# Patient Record
Sex: Female | Born: 1965 | Race: White | Hispanic: No | Marital: Married | Smoking: Never smoker
Health system: Southern US, Community
[De-identification: ages and names within clinical notes are randomized; demographics above are authoritative.]

## PROBLEM LIST (undated history)

## (undated) DIAGNOSIS — Z923 Personal history of irradiation: Secondary | ICD-10-CM

## (undated) DIAGNOSIS — Z1239 Encounter for other screening for malignant neoplasm of breast: Secondary | ICD-10-CM

## (undated) DIAGNOSIS — C50519 Malignant neoplasm of lower-outer quadrant of unspecified female breast: Secondary | ICD-10-CM

## (undated) DIAGNOSIS — E669 Obesity, unspecified: Secondary | ICD-10-CM

## (undated) DIAGNOSIS — E119 Type 2 diabetes mellitus without complications: Secondary | ICD-10-CM

## (undated) DIAGNOSIS — IMO0002 Reserved for concepts with insufficient information to code with codable children: Secondary | ICD-10-CM

## (undated) DIAGNOSIS — R920 Mammographic microcalcification found on diagnostic imaging of breast: Secondary | ICD-10-CM

## (undated) DIAGNOSIS — R928 Other abnormal and inconclusive findings on diagnostic imaging of breast: Secondary | ICD-10-CM

## (undated) DIAGNOSIS — C50919 Malignant neoplasm of unspecified site of unspecified female breast: Secondary | ICD-10-CM

## (undated) HISTORY — DX: Other abnormal and inconclusive findings on diagnostic imaging of breast: R92.8

## (undated) HISTORY — DX: Type 2 diabetes mellitus without complications: E11.9

## (undated) HISTORY — DX: Reserved for concepts with insufficient information to code with codable children: IMO0002

## (undated) HISTORY — PX: TUBAL LIGATION: SHX77

## (undated) HISTORY — DX: Mammographic microcalcification found on diagnostic imaging of breast: R92.0

## (undated) HISTORY — DX: Obesity, unspecified: E66.9

## (undated) HISTORY — DX: Encounter for other screening for malignant neoplasm of breast: Z12.39

## (undated) HISTORY — DX: Malignant neoplasm of lower-outer quadrant of unspecified female breast: C50.519

---

## 2007-04-22 ENCOUNTER — Ambulatory Visit: Payer: Self-pay

## 2009-04-25 ENCOUNTER — Ambulatory Visit: Payer: Self-pay

## 2010-06-04 ENCOUNTER — Ambulatory Visit: Payer: Self-pay

## 2011-07-02 ENCOUNTER — Ambulatory Visit: Payer: Self-pay

## 2011-08-26 DIAGNOSIS — Z1239 Encounter for other screening for malignant neoplasm of breast: Secondary | ICD-10-CM

## 2011-08-26 DIAGNOSIS — E669 Obesity, unspecified: Secondary | ICD-10-CM | POA: Insufficient documentation

## 2011-08-26 DIAGNOSIS — E119 Type 2 diabetes mellitus without complications: Secondary | ICD-10-CM

## 2011-08-26 DIAGNOSIS — R920 Mammographic microcalcification found on diagnostic imaging of breast: Secondary | ICD-10-CM

## 2011-08-26 DIAGNOSIS — R928 Other abnormal and inconclusive findings on diagnostic imaging of breast: Secondary | ICD-10-CM

## 2011-08-26 HISTORY — DX: Mammographic microcalcification found on diagnostic imaging of breast: R92.0

## 2011-08-26 HISTORY — DX: Type 2 diabetes mellitus without complications: E11.9

## 2011-08-26 HISTORY — DX: Obesity, unspecified: E66.9

## 2011-08-26 HISTORY — DX: Other abnormal and inconclusive findings on diagnostic imaging of breast: R92.8

## 2011-08-26 HISTORY — PX: BREAST LUMPECTOMY: SHX2

## 2011-08-26 HISTORY — DX: Encounter for other screening for malignant neoplasm of breast: Z12.39

## 2012-07-07 ENCOUNTER — Ambulatory Visit: Payer: Self-pay

## 2012-07-09 ENCOUNTER — Ambulatory Visit: Payer: Self-pay

## 2012-07-25 DIAGNOSIS — C50919 Malignant neoplasm of unspecified site of unspecified female breast: Secondary | ICD-10-CM

## 2012-07-25 HISTORY — DX: Malignant neoplasm of unspecified site of unspecified female breast: C50.919

## 2012-08-02 ENCOUNTER — Ambulatory Visit: Payer: Self-pay | Admitting: General Surgery

## 2012-08-02 HISTORY — PX: BREAST BIOPSY: SHX20

## 2012-08-12 ENCOUNTER — Ambulatory Visit: Payer: Self-pay | Admitting: General Surgery

## 2012-08-24 ENCOUNTER — Ambulatory Visit: Payer: Self-pay | Admitting: General Surgery

## 2012-08-24 DIAGNOSIS — C50519 Malignant neoplasm of lower-outer quadrant of unspecified female breast: Secondary | ICD-10-CM

## 2012-08-24 HISTORY — PX: BREAST EXCISIONAL BIOPSY: SUR124

## 2012-08-24 HISTORY — DX: Malignant neoplasm of lower-outer quadrant of unspecified female breast: C50.519

## 2012-08-24 HISTORY — PX: BREAST SURGERY: SHX581

## 2012-08-25 ENCOUNTER — Ambulatory Visit: Payer: Self-pay | Admitting: Radiation Oncology

## 2012-08-27 LAB — PATHOLOGY REPORT

## 2012-09-03 ENCOUNTER — Ambulatory Visit: Payer: Self-pay | Admitting: General Surgery

## 2012-09-03 HISTORY — PX: BREAST SURGERY: SHX581

## 2012-09-13 ENCOUNTER — Ambulatory Visit: Payer: Self-pay | Admitting: Radiation Oncology

## 2012-09-25 ENCOUNTER — Ambulatory Visit: Payer: Self-pay | Admitting: Radiation Oncology

## 2012-10-05 DIAGNOSIS — IMO0001 Reserved for inherently not codable concepts without codable children: Secondary | ICD-10-CM

## 2012-10-05 HISTORY — DX: Reserved for inherently not codable concepts without codable children: IMO0001

## 2012-10-14 LAB — CBC CANCER CENTER
Basophil #: 0 x10 3/mm (ref 0.0–0.1)
Eosinophil #: 0.2 x10 3/mm (ref 0.0–0.7)
Eosinophil %: 1.8 %
Lymphocyte #: 2.7 x10 3/mm (ref 1.0–3.6)
MCHC: 32.4 g/dL (ref 32.0–36.0)
Monocyte #: 0.7 x10 3/mm (ref 0.2–0.9)
Neutrophil #: 5.9 x10 3/mm (ref 1.4–6.5)
Neutrophil %: 62.2 %
RBC: 4.54 10*6/uL (ref 3.80–5.20)
RDW: 13.8 % (ref 11.5–14.5)
WBC: 9.6 x10 3/mm (ref 3.6–11.0)

## 2012-10-21 LAB — CBC CANCER CENTER
Eosinophil #: 0.2 x10 3/mm (ref 0.0–0.7)
HCT: 37.5 % (ref 35.0–47.0)
Lymphocyte %: 25.9 %
MCHC: 33.2 g/dL (ref 32.0–36.0)
Monocyte #: 0.8 x10 3/mm (ref 0.2–0.9)
Neutrophil #: 5.7 x10 3/mm (ref 1.4–6.5)
RBC: 4.46 10*6/uL (ref 3.80–5.20)
WBC: 9.1 x10 3/mm (ref 3.6–11.0)

## 2012-10-23 ENCOUNTER — Ambulatory Visit: Payer: Self-pay | Admitting: Radiation Oncology

## 2012-10-28 LAB — CBC CANCER CENTER
Basophil %: 0.4 %
Eosinophil #: 0.2 x10 3/mm (ref 0.0–0.7)
HCT: 37.4 % (ref 35.0–47.0)
Lymphocyte #: 2.4 x10 3/mm (ref 1.0–3.6)
Lymphocyte %: 25.7 %
MCH: 28.2 pg (ref 26.0–34.0)
MCV: 84 fL (ref 80–100)
Monocyte #: 0.9 x10 3/mm (ref 0.2–0.9)
Monocyte %: 9.6 %
Neutrophil #: 5.8 x10 3/mm (ref 1.4–6.5)
Neutrophil %: 62.2 %
Platelet: 332 x10 3/mm (ref 150–440)
RBC: 4.45 10*6/uL (ref 3.80–5.20)

## 2012-11-04 LAB — CBC CANCER CENTER
Basophil #: 0 x10 3/mm (ref 0.0–0.1)
Basophil %: 0.4 %
Eosinophil %: 1.8 %
HCT: 37.9 % (ref 35.0–47.0)
HGB: 12.8 g/dL (ref 12.0–16.0)
Lymphocyte #: 1.8 x10 3/mm (ref 1.0–3.6)
MCH: 28.4 pg (ref 26.0–34.0)
MCHC: 33.8 g/dL (ref 32.0–36.0)
MCV: 84 fL (ref 80–100)
Monocyte #: 0.7 x10 3/mm (ref 0.2–0.9)
Neutrophil #: 5.8 x10 3/mm (ref 1.4–6.5)
Neutrophil %: 68.4 %
Platelet: 344 x10 3/mm (ref 150–440)
RBC: 4.51 10*6/uL (ref 3.80–5.20)
WBC: 8.4 x10 3/mm (ref 3.6–11.0)

## 2012-11-18 ENCOUNTER — Encounter: Payer: Self-pay | Admitting: *Deleted

## 2012-11-18 DIAGNOSIS — C50519 Malignant neoplasm of lower-outer quadrant of unspecified female breast: Secondary | ICD-10-CM | POA: Insufficient documentation

## 2012-11-23 ENCOUNTER — Ambulatory Visit: Payer: Self-pay | Admitting: Radiation Oncology

## 2012-11-23 ENCOUNTER — Encounter: Payer: Self-pay | Admitting: General Surgery

## 2012-11-23 ENCOUNTER — Ambulatory Visit (INDEPENDENT_AMBULATORY_CARE_PROVIDER_SITE_OTHER): Payer: BC Managed Care – PPO | Admitting: General Surgery

## 2012-11-23 VITALS — BP 140/80 | HR 68 | Resp 16 | Ht 64.0 in | Wt 216.0 lb

## 2012-11-23 DIAGNOSIS — D0512 Intraductal carcinoma in situ of left breast: Secondary | ICD-10-CM

## 2012-11-23 DIAGNOSIS — D0590 Unspecified type of carcinoma in situ of unspecified breast: Secondary | ICD-10-CM

## 2012-11-23 MED ORDER — TAMOXIFEN CITRATE 20 MG PO TABS: 20.0000 mg | ORAL_TABLET | Freq: Every day | ORAL | Status: DC

## 2012-11-23 NOTE — Progress Notes (Signed)
Patient ID: Sara Wood, female   DOB: [birth year], 47 y.o.   MRN: [id number]  Chief Complaint  Patient presents with  . Follow-up    Left breast radiation for DCIS    HPI Sara Wood is a 47 y.o. female here today for a left breast excision. Finish  Radiations 11/11/12 .  HPI  Past Medical History  Diagnosis Date  . Diabetes mellitus without complication 2013  . Abnormal mammogram, unspecified 2013  . Breast screening, unspecified 2013  . Mammographic microcalcification 2013  . Obesity, unspecified 2013  . 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 Radiation 10/05/12    treatment start date for 24 treatments    Past Surgical History  Procedure Laterality Date  . Cesarean section  [phone]  . Breast surgery Left 2013    wide excision left breast  . Breast surgery  09/03/2012    Re excision of both medial and lateral margins on left breast    No family history on file.  Social History History  Substance Use Topics  . Smoking status: Never Smoker   . Smokeless tobacco: Never Used  . Alcohol Use: No    No Known Allergies  Current Outpatient Prescriptions  Medication Sig Dispense Refill  . metFORMIN (GLUCOPHAGE) 500 MG tablet Take 500 mg by mouth 2 (two) times daily with a meal.      . tamoxifen (NOLVADEX) 20 MG tablet Take 1 tablet (20 mg total) by mouth daily.  30 tablet  11   No current facility-administered medications for this visit.    Review of Systems Review of Systems  Constitutional: Negative.   Respiratory: Negative.   Cardiovascular: Negative.     Blood pressure 140/80, pulse 68, resp. rate 16, height 5\' 4"  (1.626 m), weight 216 lb (97.977 kg), last menstrual period 07/15/2012.  Physical Exam Physical Exam  Constitutional: She appears well-developed and well-nourished.  Pulmonary/Chest:      Data Reviewed The patient had an ER positive tumor. She is a candidate for antiestrogen therapy. She has  discontinued her oral contraceptives and has had one menses since that time. This lasted only 2 days. (She had been using oral contraceptives for management of menorrhagia close menses. I recommended a trial of tamoxifen, 20 mg daily. She was advised that the major risk factors include vasomotor symptoms, uterine cancer (only for those women who have stopped menstruating) and a 1% risk of DVT, equal to that of her previously prescribed oral contraceptives. She has been asked to make use of a daily pediatric aspirin tablet. The need to continue the medication for one month to see how she will tolerate long-term was emphasized. Assessment    Doing well status post wide excision to negative margins and the whole breast radiation.     Plan    Arrangements were made for a followup exam in July 2014 with a repeat left breast mammogram at that time. The patient was asked to give a phone follow up in one month with a progress report after the initiation of tamoxifen.        Earline Mayotte 11/23/2012, 8:57 PM

## 2012-11-23 NOTE — Patient Instructions (Addendum)
Patient to try tamoxifen for at least one month. Let us know how you are doing on it. Patient to take a 81 mg aspirin daily.  This patient will return in 3 months with a unilateral left breast diagnostic mammogram.

## 2012-12-28 ENCOUNTER — Telehealth: Payer: Self-pay | Admitting: *Deleted

## 2012-12-28 NOTE — Telephone Encounter (Signed)
Phone call from patient states she is taking the Tamoxifen and seems to be tolerating it well. Denies any side effects like headaches, dizziness or hot flashes. She is in recalls for June.

## 2013-01-04 IMAGING — MG MM CAD SCREENING MAMMO
1 series · 4 of 4 positions shown · non-contrast
Comparison: none

REASON FOR EXAM: screening
COMMENTS:  Submitted by practice: [facility] Scheduled by user: Panduro
Sa

PROCEDURE:     MAM - MAM DGTL SCREENING MAMMO W/CAD  - July 07, 2012  [DATE]
RESULT:     Dense nodular parenchymal pattern. No mass. Clustered
calcifications noted in the upper outer aspect of the left breast.
Compression spot films suggested for further evaluation.

[R CC · right · 4 of 4 slices shown]
[im 1/4]
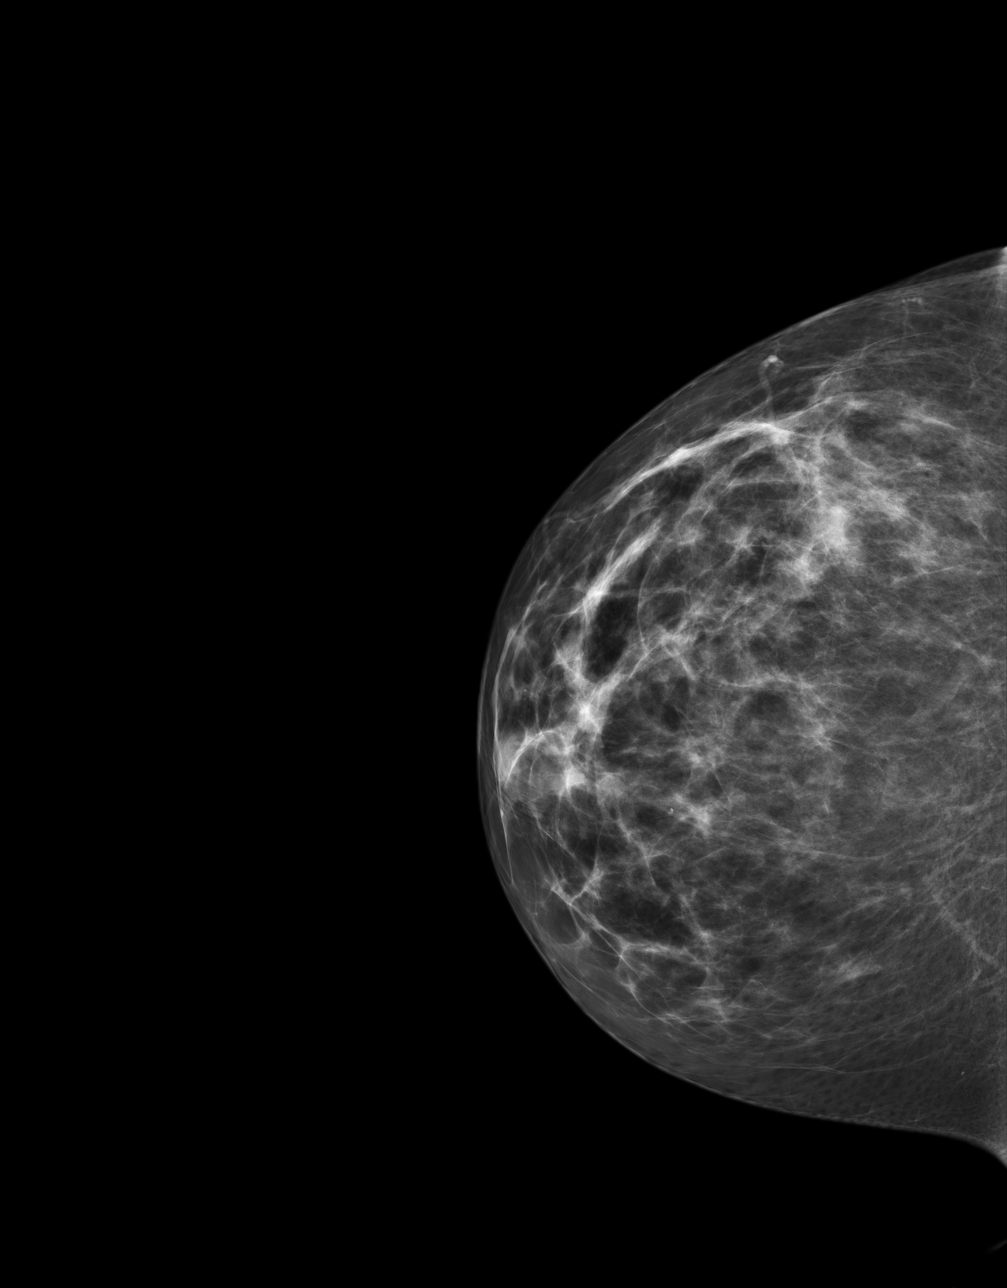
[im 2/4]
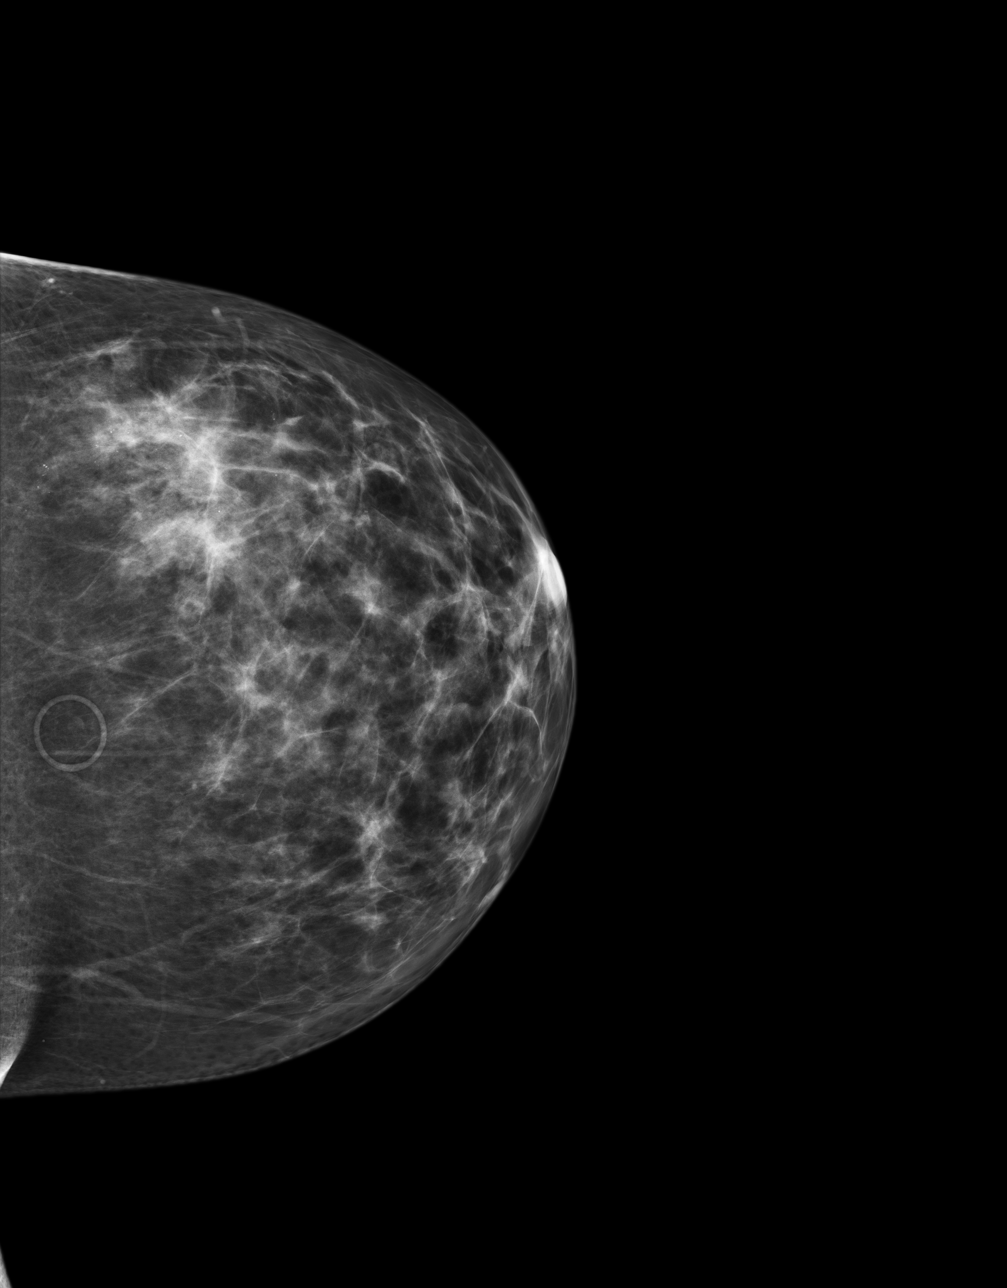
[im 3/4]
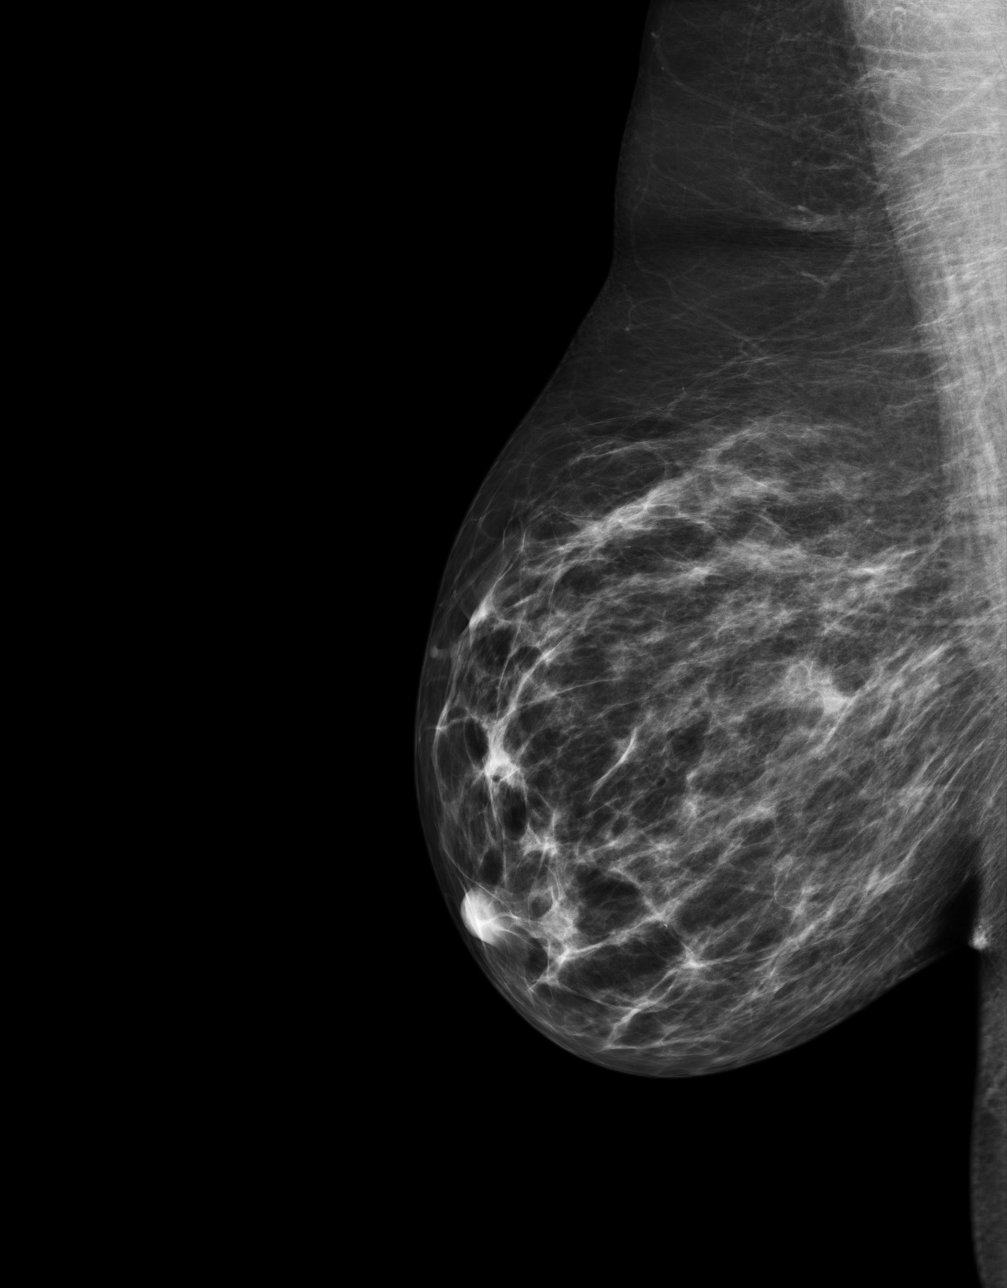
[im 4/4]
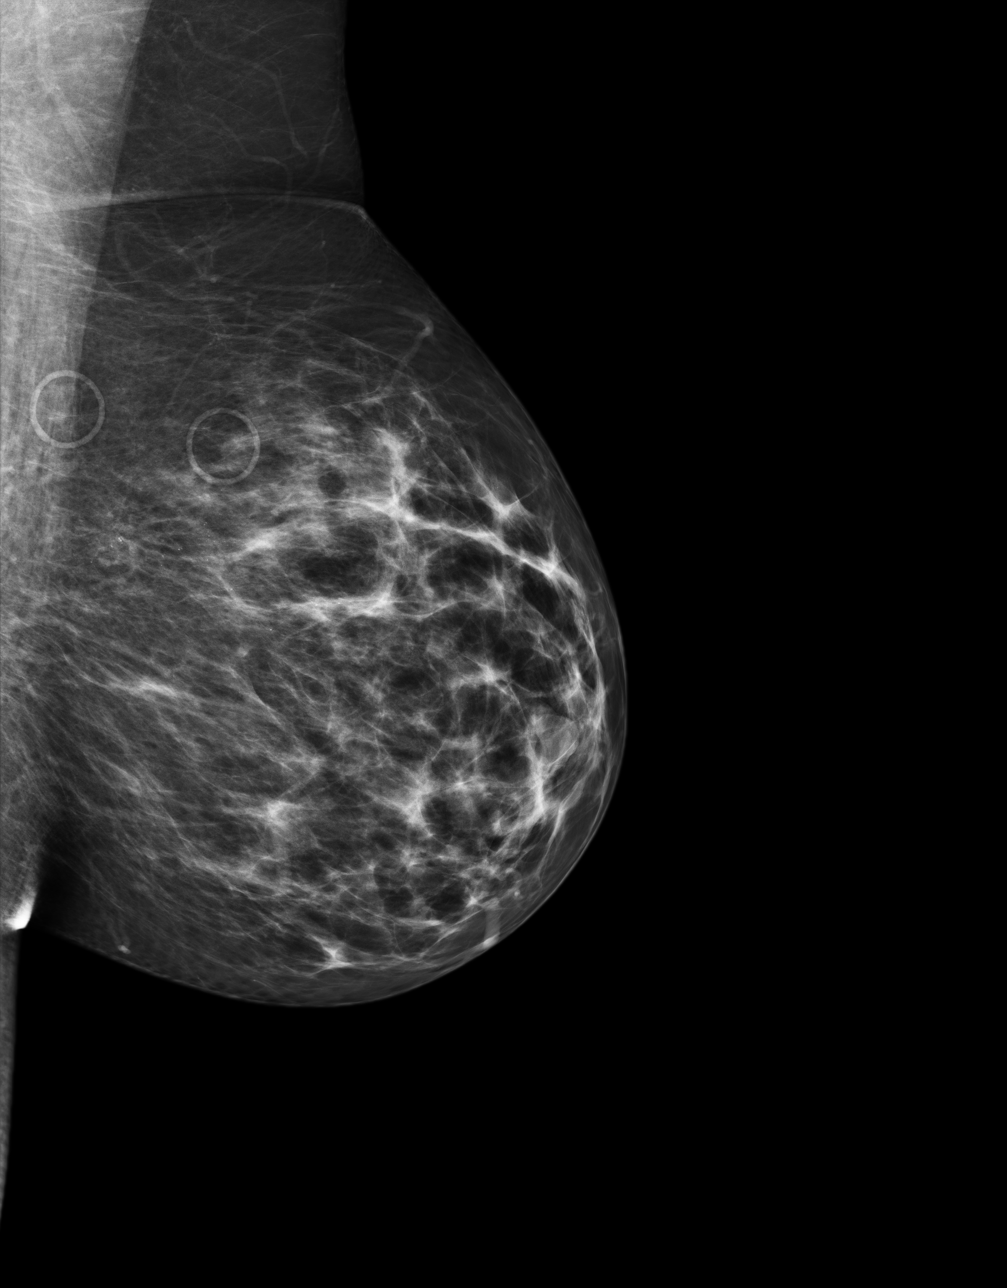

[4 of 4 positions shown; findings below may reference images not displayed]

IMPRESSION: Clustered microcalcifications upper outer aspect left
breast for which magnification spot films suggested.

BI-RADS: Catagory 0 - Need Additional Imaging Evaluation

A NEGATIVE MAMMOGRAM REPORT DOES NOT PRECLUDE BIOPSY OR OTHER EVALUATION OF
A CLINICALLY PALPABLE OR OTHERWISE SUSPICIOUS MASS OR LESION. BREAST CANCER
MAY NOT BE DETECTED IN UP TO 10% OF CASES.

## 2013-02-09 IMAGING — MG MM MAMMO DIAGNOSTIC UNILATERAL*L*
1 series · 7 of 7 positions shown · non-contrast
Comparison: none

REASON FOR EXAM: LT BRST CA FU
COMMENTS:

[L CC · left · 7 of 7 slices shown]
[im 1/7]
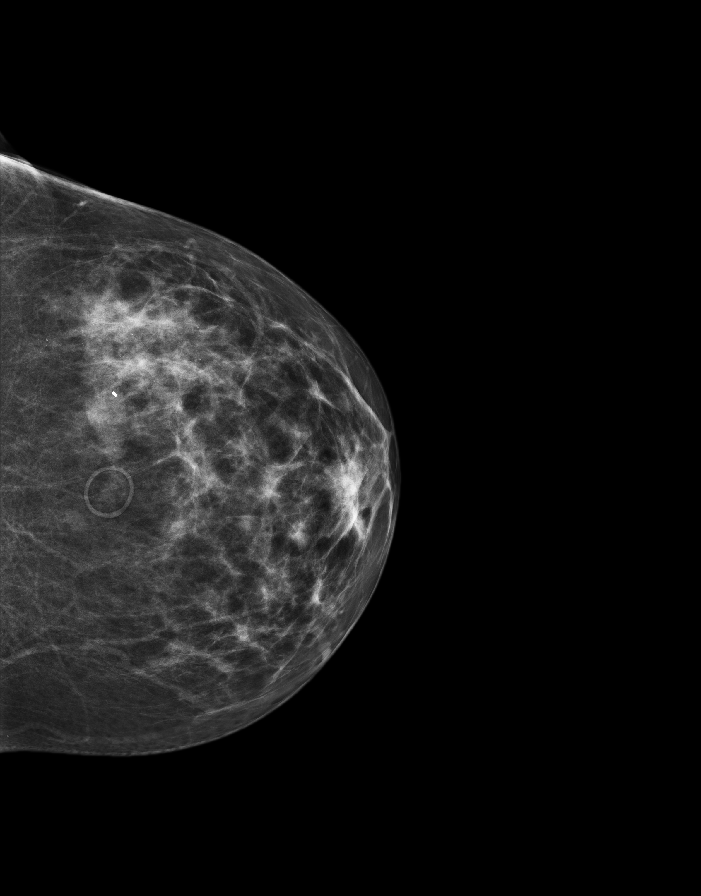
[im 2/7]
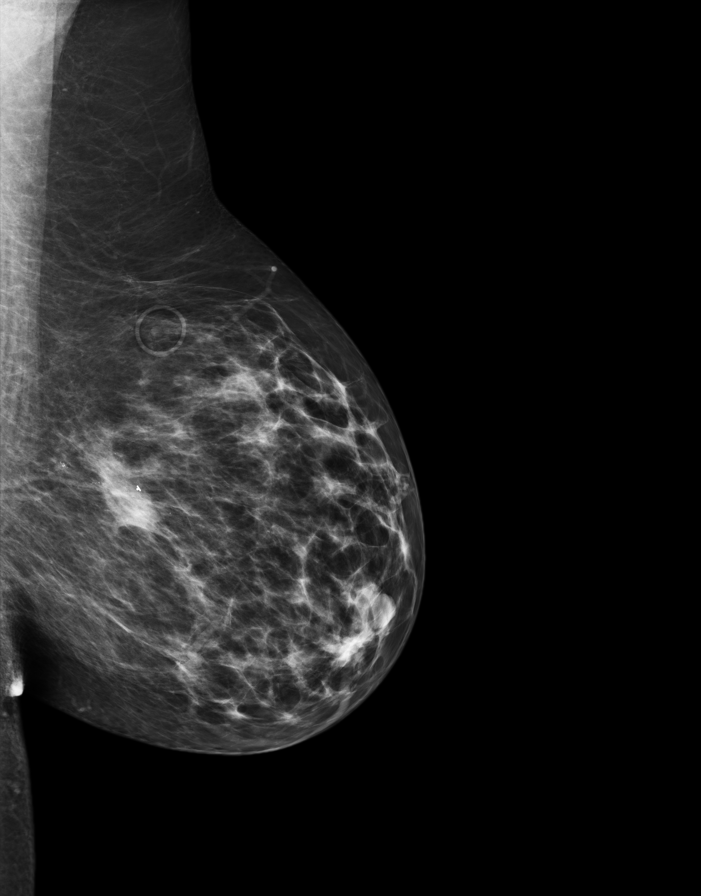
[im 3/7]
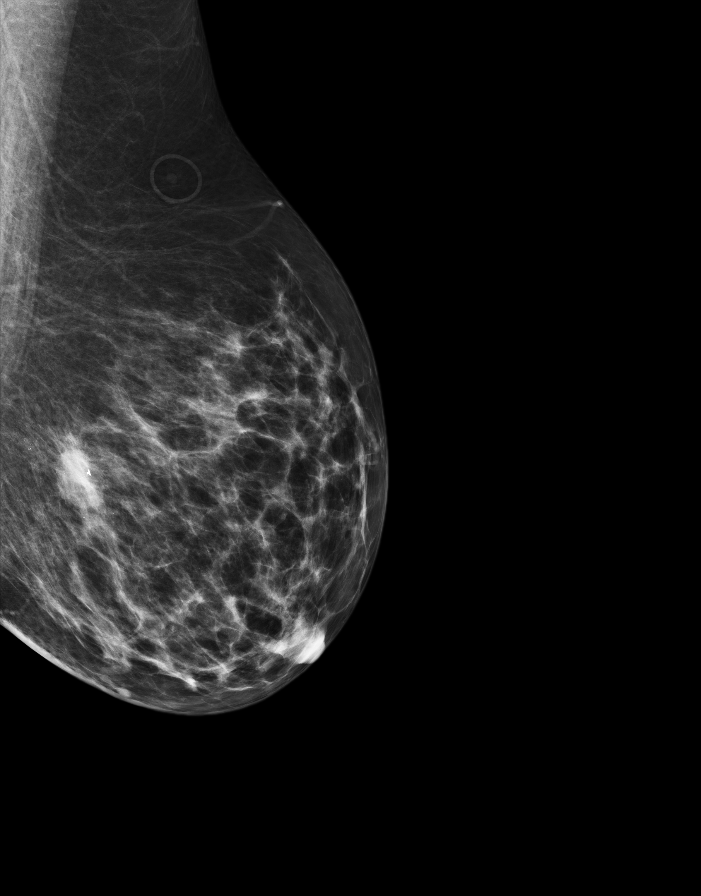
[im 4/7]
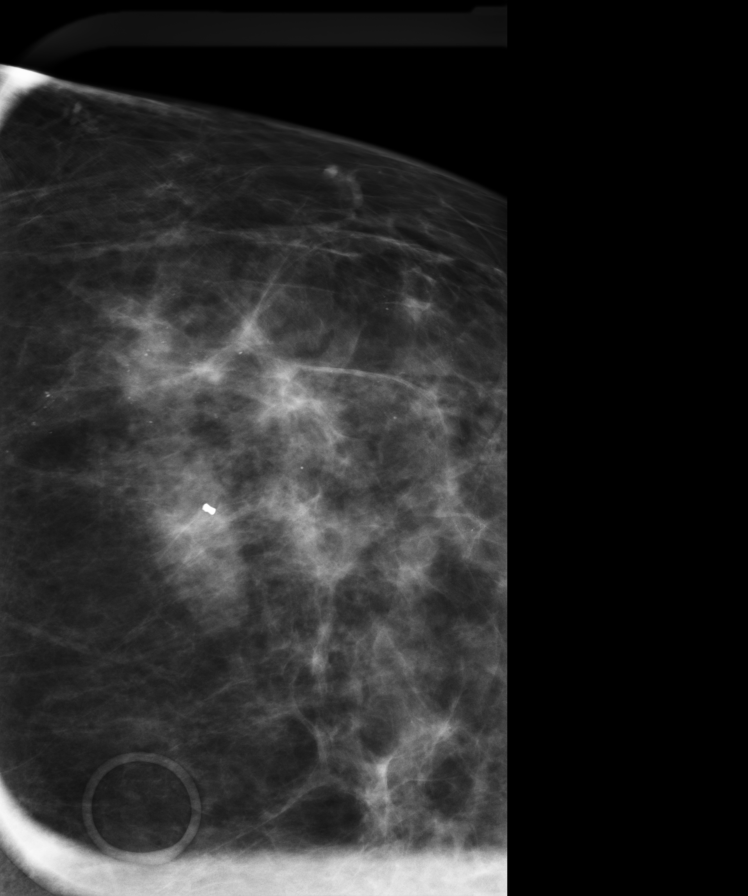
[im 5/7]
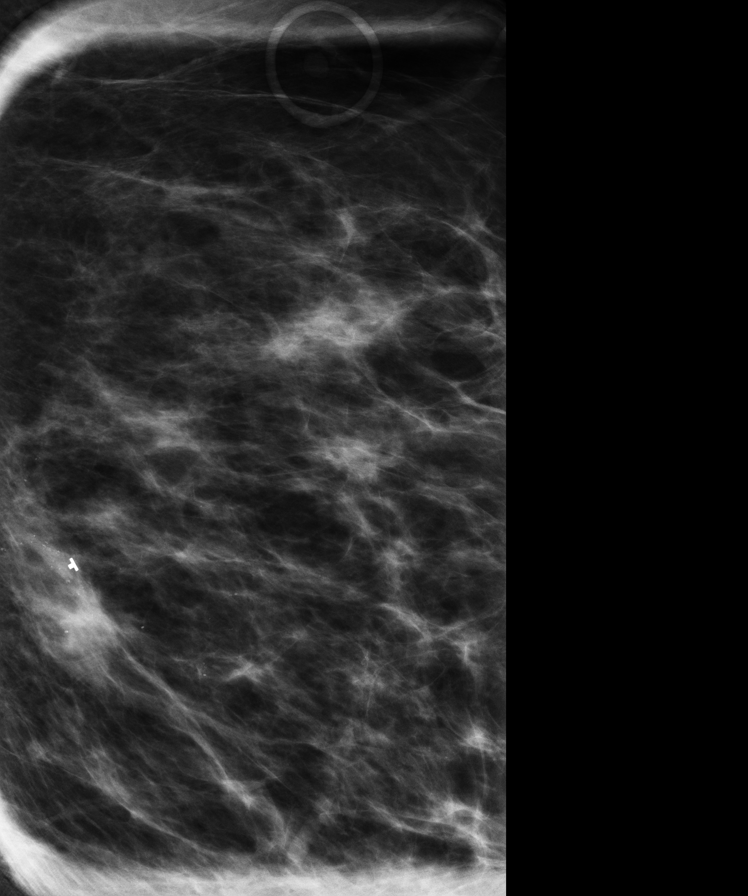
[im 6/7]
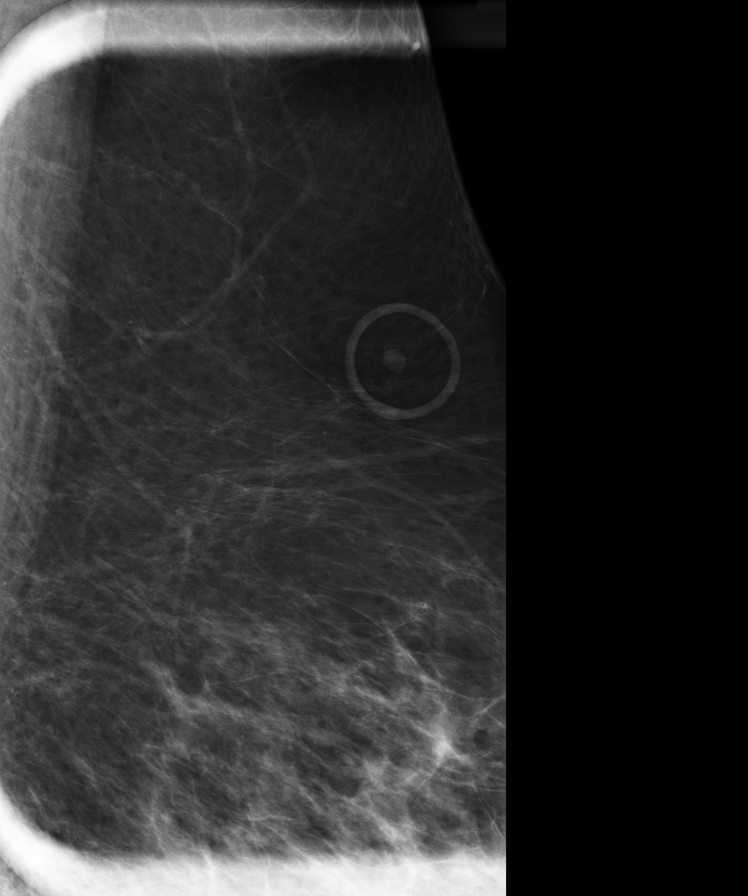
[im 7/7]
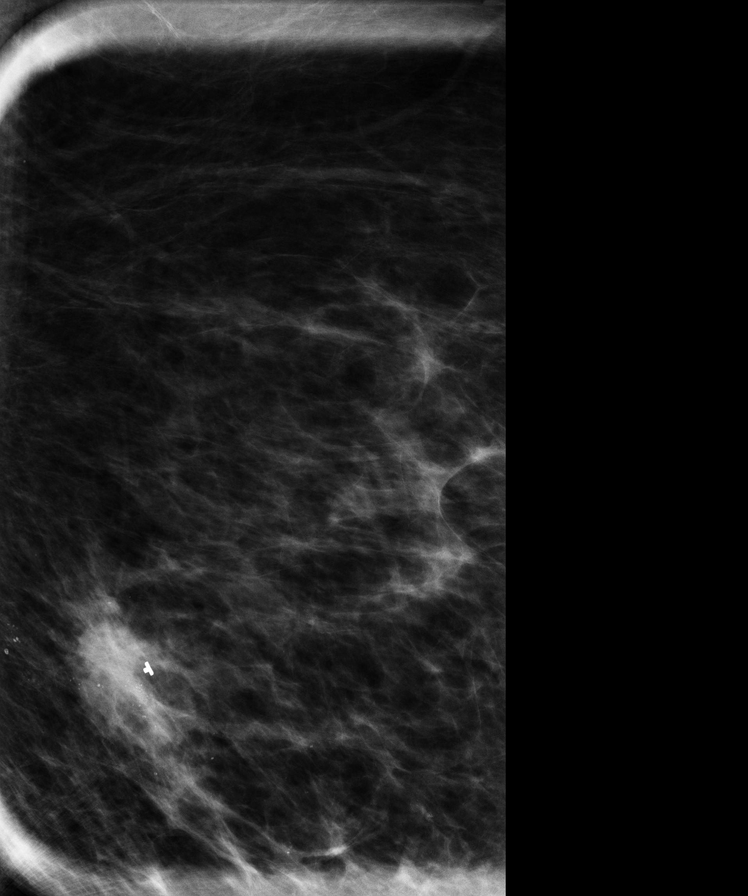

[7 of 7 positions shown; findings below may reference images not displayed]

PROCEDURE:     MAM - MAM DGTL UNI MAM LT BREAST W/CAD  - August 12, 2012  [DATE]

RESULT:     Left breast mammogram reveals stereotactic clip in the left
breast within adjacent density. This is most likely post procedural.
Followup left breast mammogram is suggested in 6 months. Microcalcifications
remain in the upper outer aspect of the left breast. The concerning
microcalcifications are more lateral than the stereotactic clip placement.
Consideration of repeat biopsy should be made.
IMPRESSION: BI-RADS: Category 4 - Suspicious Abnormality - Biopsy
Should Be Considered

## 2013-02-14 ENCOUNTER — Ambulatory Visit: Payer: Self-pay | Admitting: General Surgery

## 2013-02-21 ENCOUNTER — Encounter: Payer: Self-pay | Admitting: General Surgery

## 2013-02-21 ENCOUNTER — Ambulatory Visit (INDEPENDENT_AMBULATORY_CARE_PROVIDER_SITE_OTHER): Payer: BC Managed Care – PPO | Admitting: General Surgery

## 2013-02-21 VITALS — BP 124/88 | HR 74 | Resp 14 | Ht 64.0 in

## 2013-02-21 DIAGNOSIS — D0510 Intraductal carcinoma in situ of unspecified breast: Secondary | ICD-10-CM | POA: Insufficient documentation

## 2013-02-21 DIAGNOSIS — Z853 Personal history of malignant neoplasm of breast: Secondary | ICD-10-CM

## 2013-02-21 DIAGNOSIS — D0512 Intraductal carcinoma in situ of left breast: Secondary | ICD-10-CM

## 2013-02-21 DIAGNOSIS — D0590 Unspecified type of carcinoma in situ of unspecified breast: Secondary | ICD-10-CM

## 2013-02-21 NOTE — Progress Notes (Signed)
Patient ID: Sara Wood, female   DOB: June 18, 1966, 47 y.o.   MRN: [id number]  Chief Complaint  Patient presents with  . Breast Cancer Long Term Follow Up    HPI Sara Wood is a 47 y.o. female who presents for a breast evaluation. The most recent mammogram was done on 02/14/13 with a birad category 2. Patient does perform regular self breast checks and gets regular mammograms done. The patient has a history of left breast cancer diagnosed in 2013. She underwent a wide excision of the left breast in 2013 and re excision of the left breast in January 2014. She also had radiation therapy in February 2014. No known family history of breast cancer. No new problems with the breasts at this time. Genetic testing has been discussed in the past but the cost is too expensive for the patient.    HPI  Past Medical History  Diagnosis Date  . Diabetes mellitus without complication 2013  . Abnormal mammogram, unspecified 2013  . Breast screening, unspecified 2013  . Mammographic microcalcification 2013  . Obesity, unspecified 2013  . 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 Radiation 10/05/12    treatment start date for 24 treatments    Past Surgical History  Procedure Laterality Date  . Cesarean section  [phone]  . Breast surgery Left 2013    wide excision left breast  . Breast surgery  09/03/2012    Re excision of both medial and lateral margins on left breast    History reviewed. No pertinent family history.  Social History History  Substance Use Topics  . Smoking status: Never Smoker   . Smokeless tobacco: Never Used  . Alcohol Use: No    No Known Allergies  Current Outpatient Prescriptions  Medication Sig Dispense Refill  . aspirin 81 MG tablet Take 81 mg by mouth daily.      . metFORMIN (GLUCOPHAGE) 500 MG tablet Take 500 mg by mouth 2 (two) times daily with a meal.      . tamoxifen (NOLVADEX) 20 MG tablet Take 1 tablet (20 mg total) by  mouth daily.  30 tablet  11   No current facility-administered medications for this visit.    Review of Systems Review of Systems  Constitutional: Negative.   Respiratory: Negative.   Cardiovascular: Negative.     Blood pressure 124/88, pulse 74, resp. rate 14, height 5\' 4"  (1.626 m), last menstrual period 11/26/2012.  Physical Exam Physical Exam  Constitutional: She appears well-developed and well-nourished.  Neck: No thyromegaly present.  Cardiovascular: Normal rate, regular rhythm and normal heart sounds.   No murmur heard. Pulmonary/Chest: Effort normal and breath sounds normal. Right breast exhibits no inverted nipple, no mass, no nipple discharge, no skin change and no tenderness. Left breast exhibits no inverted nipple, no mass, no nipple discharge, no skin change and no tenderness.  Lower half of left breast some thickening from radiation therapy.  Well healed incision 3 o'clock left breast.   Lymphadenopathy:    She has no cervical adenopathy.    She has no axillary adenopathy.  Neurological: She is alert.  Skin: Skin is warm and dry.    Data Reviewed Left breast mammogram dated February 14, 2013 was reviewed. No new areas of microcalcifications. BI-RAD-2.  Assessment    DCIS, left breast.     Plan    The patient will have bilateral diagnostic mammograms in December 2014. She'll continue her tamoxifen which she is tolerating with minimal vasomotor  symptoms.        Earline Mayotte 02/21/2013, 8:23 PM

## 2013-02-21 NOTE — Patient Instructions (Addendum)
Patient to return in 6 month with bilateral diagnostic mammogram.

## 2013-06-17 ENCOUNTER — Ambulatory Visit: Payer: Self-pay | Admitting: Radiation Oncology

## 2013-06-25 ENCOUNTER — Ambulatory Visit: Payer: Self-pay | Admitting: Radiation Oncology

## 2013-08-15 ENCOUNTER — Ambulatory Visit: Payer: Self-pay | Admitting: General Surgery

## 2013-08-15 ENCOUNTER — Encounter: Payer: Self-pay | Admitting: General Surgery

## 2013-08-30 ENCOUNTER — Ambulatory Visit: Payer: BC Managed Care – PPO | Admitting: General Surgery

## 2013-11-16 ENCOUNTER — Ambulatory Visit (INDEPENDENT_AMBULATORY_CARE_PROVIDER_SITE_OTHER): Payer: BC Managed Care – PPO | Admitting: General Surgery

## 2013-11-16 ENCOUNTER — Encounter: Payer: Self-pay | Admitting: General Surgery

## 2013-11-16 VITALS — BP 140/74 | HR 78 | Resp 14 | Ht 64.0 in | Wt 217.0 lb

## 2013-11-16 DIAGNOSIS — Z853 Personal history of malignant neoplasm of breast: Secondary | ICD-10-CM

## 2013-11-16 DIAGNOSIS — D0590 Unspecified type of carcinoma in situ of unspecified breast: Secondary | ICD-10-CM

## 2013-11-16 DIAGNOSIS — D0510 Intraductal carcinoma in situ of unspecified breast: Secondary | ICD-10-CM

## 2013-11-16 NOTE — Patient Instructions (Signed)
Patient to return in January 2016 with bilateral diagnostic mammogram. Continue self breast exams. Call office for any new breast issues or concerns.  

## 2013-11-16 NOTE — Progress Notes (Signed)
Patient ID: Sara Wood, female   DOB: 11/07/1965, 48 y.o.   MRN: [id number]  Chief Complaint  Patient presents with  . Follow-up    bilateral diagnostic mammogram     HPI Sara Wood is a 48 y.o. female who presents for a breast cancer evaluation. The most recent mammogram was done on 08/15/13. Patient does perform regular self breast checks and gets regular mammograms done.  The patient denies any problems with the breasts at this time.   The patient is tolerating her tamoxifen therapy without ill effect.  The patient reports that her menses became somewhat irregular in fall 2014. She has skipped several months, with her last period occurring in November, 2014.  The patient reported that after the radiologist reviewed her mammogram she was going to cancel her appointment, but she did agree to calm. She would like not to have to come for exams. The patient was offered genetic testing at the time of diagnosis, but at that time her insurance would not cover the testing.   HPI  Past Medical History  Diagnosis Date  . Diabetes mellitus without complication [id number]  . Abnormal mammogram, unspecified 2013  . Breast screening, unspecified 2013  . Mammographic microcalcification 2013  . Obesity, unspecified 2013  . Radiation 10/05/12    treatment start date for 24 treatments  . 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    Past Surgical History  Procedure Laterality Date  . Cesarean section  [phone]  . Tubal ligation    . Breast surgery Left 08/24/2012    Wide excision left breast DCIS  . Breast surgery  09/03/2012    Re excision of both medial and lateral margins on left breast    History reviewed. No pertinent family history.  Social History History  Substance Use Topics  . Smoking status: Never Smoker   . Smokeless tobacco: Never Used  . Alcohol Use: No    No Known  Allergies  Current Outpatient Prescriptions  Medication Sig Dispense Refill  . aspirin 81 MG tablet Take 81 mg by mouth daily.      Marland Kitchen atorvastatin (LIPITOR) 40 MG tablet Take 1 tablet by mouth daily.      . metFORMIN (GLUCOPHAGE) 500 MG tablet Take 500 mg by mouth 2 (two) times daily with a meal.      . tamoxifen (NOLVADEX) 20 MG tablet Take 1 tablet (20 mg total) by mouth daily.  30 tablet  11   No current facility-administered medications for this visit.    Review of Systems Review of Systems  Constitutional: Negative.   Respiratory: Negative.   Cardiovascular: Negative.     Blood pressure 140/74, pulse 78, resp. rate 14, height 5\' 4"  (1.626 m), weight 217 lb (98.431 kg).  Physical Exam Physical Exam  Constitutional: She is oriented to person, place, and time. She appears well-developed and well-nourished.  Neck: Neck supple. No thyromegaly present.  Cardiovascular: Normal rate, regular rhythm and normal heart sounds.   No murmur heard. Pulmonary/Chest: Effort normal and breath sounds normal. Right breast exhibits no inverted nipple, no mass, no nipple discharge, no skin change and no tenderness. Left breast exhibits no inverted nipple, no mass, no nipple discharge, no skin change and no tenderness.  Well healed incision on the left at 3 o'clock. 1 cup size smaller than right.   Lymphadenopathy:    She has no cervical adenopathy.    She  has no axillary adenopathy.  Neurological: She is alert and oriented to person, place, and time.  Skin: Skin is warm and dry.    Data Reviewed Original pathology showed intermediate grade DCIS. Resected to clear margins. Treated with whole breast radiation.  Bilateral mammograms dated 08/15/2013 showed postoperative changes. BI-RAD-1.  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    Plan    The patient can speak with her gyn provider, Ardeth Perfect, PA, or her PCP to see if they would take  responsibility for administration of tamoxifen (for an additional 4 years and scheduling her mammograms and clinical breast exams. She was encouraged to recontact her insurance carrier to see if the RCA testing would be covered, as if she was positive prophylactic ovary removal would be indicated.  We'll tentatively plan for repeat exam in one year with bilateral diagnostic mammograms at that time.    PCP: Dr. Fonnie Jarvis. Erenest Blank 11/17/2013, 10:28 AM

## 2013-11-17 ENCOUNTER — Encounter: Payer: Self-pay | Admitting: General Surgery

## 2013-11-17 DIAGNOSIS — D0510 Intraductal carcinoma in situ of unspecified breast: Secondary | ICD-10-CM | POA: Insufficient documentation

## 2013-11-21 ENCOUNTER — Encounter: Payer: Self-pay | Admitting: General Surgery

## 2013-11-22 ENCOUNTER — Ambulatory Visit: Payer: BC Managed Care – PPO | Admitting: General Surgery

## 2014-03-07 ENCOUNTER — Other Ambulatory Visit: Payer: Self-pay | Admitting: General Surgery

## 2014-06-26 ENCOUNTER — Encounter: Payer: Self-pay | Admitting: General Surgery

## 2014-08-21 ENCOUNTER — Ambulatory Visit: Payer: Self-pay | Admitting: General Surgery

## 2014-08-21 ENCOUNTER — Encounter: Payer: Self-pay | Admitting: General Surgery

## 2014-09-11 ENCOUNTER — Ambulatory Visit: Payer: BC Managed Care – PPO | Admitting: General Surgery

## 2014-12-15 NOTE — Op Note (Signed)
PATIENT NAME:  Sara Wood, NANNINI MR#:  [id number] DATE OF BIRTH:  October 15, 1965  DATE OF PROCEDURE:  08/24/2012  PREOPERATIVE DIAGNOSIS: Ductal carcinoma in situ, left breast.   POSTOPERATIVE DIAGNOSIS:  Ductal carcinoma in situ, left breast  PROCEDURE PERFORMED: 1.  Wide local excision. 2.  Mastoplasty.   SURGEON: Robert Bellow, MD.   ANESTHESIA: General by LMA under Dr. Carolin Sicks.   ESTIMATED BLOOD LOSS: Less than 10 mL.   INDICATIONS FOR SURGERY: This 49 year old woman was recently identified with DCIS based on stereotactic biopsy. Post biopsy mammogram showed significant residual disease. She underwent needle localization this morning with bracketing of the lesion on the superior and inferior aspects. Plans are for wide local excision.   PROCEDURE IN DETAIL: With the patient under adequate general anesthesia, the area was prepped with ChloraPrep and draped. The post-wire localization films were reviewed and a radial incision at the three o'clock position was made. This was carried through the skin and subcutaneous tissue with hemostasis achieved by electrocautery. The adipose tissue was elevated off the breast parenchyma and both localizing wires were identified. A block of tissue approximately 5 cm x 5 cm x 3 cm was excised down to, but not including the pectoralis fascia. Specimen was orientated and post-excision specimen radiograph confirmed both intact wires and the calcifications of concern. After assuring good hemostasis, the breast was elevated off the underlying pectoralis fascia both superiorly and inferiorly and then approximated with interrupted 2-0 Vicryl figure-of-eight sutures at the pectoralis fascia level. The superficial level of the breast parenchyma was approximated with interrupted 2-0 Vicryl figure-of-8 sutures and then the adipose layer approximated in a similar fashion after appropriate level of sedation. The skin was closed with running 4-0 Vicryl subcuticular suture. A  total of 30 mL of 0.5% Marcaine with 1:200,000 units epinephrine was utilized during the procedure for postoperative analgesia. Benzoin and SteriStrips, followed by Telfa, Fluff gauze, Kerlix and an Ace wrap was then applied.   The patient tolerated the procedure well and was taken to the recovery room in stable condition.    ____________________________ Robert Bellow, MD jwb:eg D: 08/24/2012 19:35:42 ET T: 08/24/2012 20:27:15 ET JOB#: [id number]  cc:  1.  Robert Bellow, MD, <Dictator> 2.  Fonnie Jarvis. Ilene Qua, MD 3.  Alicia P. Copland, MD   Ayannah Faddis Amedeo Kinsman MD ELECTRONICALLY SIGNED 08/25/2012 11:58

## 2014-12-15 NOTE — Op Note (Signed)
PATIENT NAME:  Sara Wood, MOTSINGER MR#:  [id number] DATE OF BIRTH:  1966/01/16  DATE OF PROCEDURE:  09/03/2012  PREOPERATIVE DIAGNOSIS: Ductal carcinoma in situ, left breast, positive margins on original excision.   POSTOPERATIVE DIAGNOSIS: Ductal carcinoma in situ, left breast, positive margins on original excision.   OPERATIVE PROCEDURE: Re-excision of the medial and lateral margins of the left breast wide excision site.   OPERATING SURGEON: Robert Bellow, MD   ANESTHESIA: General by LMA under Dr. Benjamine Mola, Marcaine 0.5% with 1:200,000 units epinephrine, 30 mL local infiltration.   ESTIMATED BLOOD LOSS: Minimal.   CLINICAL NOTE: This 49 year old woman was recently diagnosed with DCIS. She had initially undergone wide excision with bracketing wires. On resection of the 4 - 5 cm mass of tissue, she was found to have a single 0.05 mm duct transected medially and several small ducts transected laterally. She was felt to be a candidate for re-excision.   OPERATIVE NOTE: With the patient under adequate general anesthesia and having received Kefzol intravenously, the breast was taped medially and then prepped with ChloraPrep and draped. The original radial incision at the 3 o'clock position was opened sharply. The multiple layers previously closed were exposed. The cavity was then evaluated and the medial end approached first. An additional 15 mm of tissue was taken from the medial end of the biopsy cavity. The new medial border was placed on a Telfa pad and sewn in place. The specimen was orientated with margin marks. It was delivered fresh to pathology. The lateral border was approached in the same way. Here, the breast tissue was excised into the adipose tissue and down to and including the pectoralis fascia. This was also treated in a similar fashion with the new lateral border adherent to the Telfa pad and the material sutured to the pad for orientation. Hemostasis was with electrocautery. The breast  tissue was then approximated in multiple layers with interrupted 2-0 Vicryl figure-of-eight sutures. The skin was closed with running 4-0 Vicryl subcuticular suture. Benzoin and Steri-Strips were applied. Telfa pad followed by fluff gauze, Kerlix and an Ace wrap were then applied.   The patient tolerated the procedure well and was taken to the recovery room in stable condition.    ____________________________ Robert Bellow, MD jwb:OSi D: 09/03/2012 17:23:58 ET T: 09/04/2012 12:42:29 ET JOB#: [id number]  cc: Robert Bellow, MD, <Dictator> Fonnie Jarvis. Ilene Qua, MD JEFFREY Amedeo Kinsman MD ELECTRONICALLY SIGNED 09/04/2012 14:57

## 2014-12-15 NOTE — Consult Note (Signed)
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   Referred by Dr. Hervey Ard.  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 This mammogram was performed in July 07, 2012. She went under case biopsy by Dr. Hervey Ard positive for ductal carcinoma in situ. She then underwent wide local excision for a 3 cm grade 2 ductal carcinoma in situ ER/PR positive. Lateral and medial margin was involved she underwent reexcision with negative margins. She has done well postoperatively. She has been on birth control pills I've asked her to discontinue the use of them. She's had a tubal ligation and is using birth control pills to regulate her menstrual periods. She otherwise is without complaint. She specifically denies breast tenderness cough or bone pain.  Past Hx:    GERD - Esophageal Reflux:    Heart Murmer:    Diabetes:    Anemia: h/o   Obesity:    Breast Cancer: left   Removal of left breast cancer: Dec 2013   Left breast stereotactic biopsy: Dec 2013   Cesarean Section: x3  Past, Family and Social History:   Past Medical History positive    Cardiovascular systolic ejection murmur    Gastrointestinal GERD    Endocrine diabetes mellitus    Past Surgical History tubal ligation, C-section    Past Medical History Comments obesity    Family History noncontributory     Social History noncontributory    Additional Past Medical and Surgical History seen accompanied by nurse navigator today   Allergies:   No Known Allergies:   Home Meds:  Home Medications: Medication Instructions Status  Tylenol Caplet Extra Strength 500 mg oral tablet 2 tab(s) orally every 6 hours as needed for soreness. Active  metformin 500 mg oral tablet 1 tab(s) orally once a day (in the evening) Active  Kariva oral tablet 1 tab(s) orally once a day Active  OTC [employer] Acid Reducer 1 tab(s) orally once a day, As Needed Active   Review of Systems:   General negative    Performance Status (ECOG) 0    Skin negative    Breast see HPI    Ophthalmologic negative    ENMT negative    Respiratory and Thorax negative    Cardiovascular negative    Gastrointestinal negative    Genitourinary negative    Musculoskeletal negative    Neurological negative    Psychiatric negative    Hematology/Lymphatics negative    Endocrine see HPI    Allergic/Immunologic negative    Review of Systems   according to nurses notesPatient denies any weight loss, fatigue, weakness, fever, chills or night sweats. Patient denies any loss of vision, blurred vision. Patient denies any ringing  of the ears or hearing loss. No irregular heartbeat. Patient denies heart murmur or history of fainting. Patient denies any chest pain or pain radiating to her upper extremities. Patient denies any  shortness of breath, difficulty breathing at night, cough or hemoptysis. Patient denies any swelling in the lower legs. Patient denies any nausea vomiting, vomiting of blood, or coffee ground material in the vomitus. Patient denies any stomach pain. Patient states has had normal bowel movements no significant constipation or diarrhea. Patient denies any dysuria, hematuria or significant nocturia. Patient denies any problems walking, swelling in the joints or loss of balance. Patient denies any skin changes, loss of  hair or loss of weight. Patient denies any excessive worrying or anxiety or significant depression. Patient denies any problems with insomnia. Patient denies excessive thirst, polyuria, polydipsia. Patient denies any swollen glands, patient denies easy bruising or easy bleeding. Patient denies any recent infections, allergies or URI. Patient "s visual fields have not changed significantly in recent time.  Nursing Notes:  Nursing Vital Signs and Chemo Nursing Nursing Notes: *CC Vital Signs Flowsheet:   22-Jan-14 15:01   Temp Temperature 99.2   Pulse Pulse 92   Respirations Respirations 20   SBP SBP 134   DBP DBP 88   Pain Scale (0-10)  0   Current Weight (kg) (kg) 94.6   Height (cm) centimeters 160.5   BSA (m2) 1.9   Physical Exam:  General/Skin/HEENT:   General normal    Skin normal    Eyes normal    ENMT normal    Head and Neck normal    Additional PE well-developed slightly obese female in NAD. Left breast is a wide local excision site which is well-healed. No dominant mass or nodularity is noted in either breast into position examined. No axillary or supraclavicular adenopathy is appreciated. Lungs are clear to A&P cardiac examination shows a 2/6 systolic ejection murmur.   Breasts/Resp/CV/GI/GU:   Respiratory and Thorax normal    Cardiovascular normal    Gastrointestinal normal    Genitourinary normal   MS/Neuro/Psych/Lymph:   Musculoskeletal normal    Neurological normal    Psychiatric normal    Lymphatics normal   Assessment and Plan:  Impression:   ductal carcinoma in situ of the left breast status post wide local excision with negative margins after reexcision and 49 year old female.  Plan:   based on the patient's age, overall size of her tumor believe she would be best benefit from whole breast radiation. Would plan on delivering 5000 cGy over 5 weeks and boost to her scar another 1400 cGy with electron beam. She is cautioned area form accelerated  partial breast irradiation based on her young age and size of the tumor. I have discussed the case personally with Dr. Lillia Abed who agrees with my opinion. She does have a co-pay with insurance we are making sure this is not significantly impact her overall cost of her treatments. I have set her up for CT simulation for later next week. Risks and benefits of treatment including redness and itching of the skin, inclusive some superficial lung, alteration of blood counts, and fatigue were all explained in detail to the patient.  I would like to take this opportunity to thank you for allowing me to continue to participate in this patient's care.  CC Referral:   cc: Dr. Hervey Ard, Dr. Domenick Gong   Electronic Signatures: Armstead Peaks (MD)  (Signed 22-Jan-14 15:43)  Authored: HPI, Diagnosis, Past Hx, PFSH, Allergies, Home Meds, ROS, Nursing Notes, Physical Exam, Encounter Assessment and Plan, CC Referring Physician   Last Updated: 22-Jan-14 15:43 by Armstead Peaks (MD)

## 2015-02-14 ENCOUNTER — Encounter: Payer: Self-pay | Admitting: General Surgery

## 2015-02-14 ENCOUNTER — Ambulatory Visit (INDEPENDENT_AMBULATORY_CARE_PROVIDER_SITE_OTHER): Payer: BC Managed Care – PPO | Admitting: General Surgery

## 2015-02-14 VITALS — BP 152/80 | HR 82 | Resp 12 | Ht 64.0 in | Wt 220.0 lb

## 2015-02-14 DIAGNOSIS — D0512 Intraductal carcinoma in situ of left breast: Secondary | ICD-10-CM | POA: Diagnosis not present

## 2015-02-14 MED ORDER — TAMOXIFEN CITRATE 20 MG PO TABS: 20.0000 mg | ORAL_TABLET | Freq: Every day | ORAL | Status: DC

## 2015-02-14 NOTE — Progress Notes (Signed)
Patient ID: Sara Wood, female   DOB: 06/03/1966, 49 y.o.   MRN: [id number]  Chief Complaint  Patient presents with  . Follow-up    mammogram    HPI Sara Wood is a 49 y.o. female who presents for a breast evaluation. The most recent mammogram was done on 08/22/15. She reports no new breast problems. Patient does perform regular self breast checks and gets regular mammograms done.     HPI  Past Medical History  Diagnosis Date  . Diabetes mellitus without complication 0998  . Abnormal mammogram, unspecified 2013  . Breast screening, unspecified 2013  . Mammographic microcalcification 2013  . Obesity, unspecified 2013  . Radiation 10/05/12    treatment start date for 24 treatments  . 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    Past Surgical History  Procedure Laterality Date  . Cesarean section  [phone]  . Tubal ligation    . Breast surgery Left 08/24/2012    Wide excision left breast DCIS  . Breast surgery  09/03/2012    Re excision of both medial and lateral margins on left breast    No family history on file.  Social History History  Substance Use Topics  . Smoking status: Never Smoker   . Smokeless tobacco: Never Used  . Alcohol Use: No    No Known Allergies  Current Outpatient Prescriptions  Medication Sig Dispense Refill  . aspirin 81 MG tablet Take 81 mg by mouth daily.    Marland Kitchen atorvastatin (LIPITOR) 40 MG tablet Take 1 tablet by mouth daily.    . metFORMIN (GLUCOPHAGE) 500 MG tablet Take 500 mg by mouth 2 (two) times daily with a meal.    . tamoxifen (NOLVADEX) 20 MG tablet Take 1 tablet (20 mg total) by mouth daily. 90 tablet 4   No current facility-administered medications for this visit.    Review of Systems Review of Systems  Constitutional: Negative.   Respiratory: Negative.   Cardiovascular: Negative.     Blood pressure 152/80, pulse 82,  resp. rate 12, height 5\' 4"  (1.626 m), weight 220 lb (99.791 kg).  Physical Exam Physical Exam  Constitutional: She appears well-developed and well-nourished.  HENT:  Mouth/Throat: Oropharynx is clear and moist.  Eyes: Conjunctivae are normal. No scleral icterus.  Neck: Neck supple.  Cardiovascular: Normal rate, regular rhythm and normal heart sounds.   Pulmonary/Chest: Effort normal and breath sounds normal. Right breast exhibits no inverted nipple, no mass, no nipple discharge, no skin change and no tenderness. Left breast exhibits no inverted nipple, no mass, no nipple discharge, no skin change and no tenderness.  Left breast well healed incision at 3 o'clk.   Lymphadenopathy:    She has no cervical adenopathy.    She has no axillary adenopathy.    Data Reviewed Bilateral mammograms dated 08/21/2014 were reviewed. No interval change. Postsurgical changes noted. BI-RADS-2.  Assessment    Benign breast exam.  Tolerating tamoxifen therapy.    Plan    The patient got off cycle (Emma grams/exam) sees. We'll plan for bilateral mammograms in 6 months with an office visit in one year.    PCP: Dr Lance Coon   Sara Wood 02/15/2015, 8:14 PM

## 2015-06-15 ENCOUNTER — Other Ambulatory Visit: Payer: Self-pay

## 2015-06-15 DIAGNOSIS — D0512 Intraductal carcinoma in situ of left breast: Secondary | ICD-10-CM

## 2015-08-23 ENCOUNTER — Ambulatory Visit
Admission: RE | Admit: 2015-08-23 | Discharge: 2015-08-23 | Disposition: A | Discharge status: Home / Self Care | Payer: BC Managed Care – PPO | Source: Ambulatory Visit | Attending: General Surgery | Admitting: General Surgery

## 2015-08-23 ENCOUNTER — Other Ambulatory Visit: Payer: Self-pay | Admitting: General Surgery

## 2015-08-23 DIAGNOSIS — D0512 Intraductal carcinoma in situ of left breast: Secondary | ICD-10-CM

## 2015-08-23 DIAGNOSIS — Z853 Personal history of malignant neoplasm of breast: Secondary | ICD-10-CM | POA: Diagnosis present

## 2015-08-23 HISTORY — DX: Malignant neoplasm of unspecified site of unspecified female breast: C50.919

## 2015-11-22 ENCOUNTER — Encounter: Payer: Self-pay | Admitting: *Deleted

## 2015-11-22 ENCOUNTER — Other Ambulatory Visit: Payer: Self-pay | Admitting: *Deleted

## 2016-02-13 ENCOUNTER — Encounter: Payer: Self-pay | Admitting: *Deleted

## 2016-02-14 ENCOUNTER — Encounter: Payer: Self-pay | Admitting: General Surgery

## 2016-02-14 ENCOUNTER — Ambulatory Visit (INDEPENDENT_AMBULATORY_CARE_PROVIDER_SITE_OTHER): Payer: BC Managed Care – PPO | Admitting: General Surgery

## 2016-02-14 VITALS — BP 154/92 | HR 78 | Resp 14 | Ht 64.0 in | Wt 218.0 lb

## 2016-02-14 DIAGNOSIS — D0512 Intraductal carcinoma in situ of left breast: Secondary | ICD-10-CM | POA: Diagnosis not present

## 2016-02-14 NOTE — Patient Instructions (Addendum)
The patient is aware to call back for any questions or concerns. The patient has been asked to return to the office in one year with a bilateral diagnostic mammogram. 

## 2016-02-14 NOTE — Progress Notes (Signed)
Patient ID: Sara Wood, female   DOB: [birth year], 50 y.o.   MRN: [id number]  Chief Complaint  Patient presents with  . Follow-up    breast cancer    HPI Sara Wood is a 50 y.o. female.  who presents for her follow up breast cancer and a breast evaluation. The most recent mammogram was done on 08-23-15 .  Patient does perform regular self breast checks and gets regular mammograms done.  No new issues. Tolerating the tamoxifen.  I personally reviewed the patient's history.  HPI  Past Medical History  Diagnosis Date  . Diabetes mellitus without complication ([place]) 0000000  . Abnormal mammogram, unspecified 2013  . Breast screening, unspecified 2013  . Mammographic microcalcification 2013  . Obesity, unspecified 2013  . Radiation 10/05/12    treatment start date for 24 treatments  . Malignant neoplasm of lower-outer quadrant of female breast ([place]) 08/24/2012    Intermediate grade DCIS, ER 90%, PR 90% breast radiation.  . Breast cancer ([place]) 07/2012    left breast    Past Surgical History  Procedure Laterality Date  . Cesarean section  [phone]  . Tubal ligation    . Breast surgery Left 08/24/2012    Wide excision left breast DCIS  . Breast surgery  09/03/2012    Re excision of both medial and lateral margins on left breast  . Breast biopsy Left 08/02/2012    positive, stereo  . Breast excisional biopsy Left 08/24/2012    radiation    No family history on file.  Social History Social History  Substance Use Topics  . Smoking status: Never Smoker   . Smokeless tobacco: Never Used  . Alcohol Use: No    No Known Allergies  Current Outpatient Prescriptions  Medication Sig Dispense Refill  . aspirin 81 MG tablet Take 81 mg by mouth daily.    . metFORMIN (GLUCOPHAGE) 500 MG tablet Take 500 mg by mouth 2 (two) times daily with a meal.    . tamoxifen (NOLVADEX) 20 MG tablet Take 1 tablet (20 mg total) by mouth daily. 90 tablet 4   No current  facility-administered medications for this visit.    Review of Systems Review of Systems  Constitutional: Negative.   Respiratory: Negative.   Cardiovascular: Negative.     Blood pressure 154/92, pulse 78, resp. rate 14, height 5\' 4"  (1.626 m), weight 218 lb (98.884 kg), last menstrual period 08/25/2012.  Physical Exam Physical Exam  Constitutional: She is oriented to person, place, and time. She appears well-developed and well-nourished.  HENT:  Mouth/Throat: Oropharynx is clear and moist.  Eyes: Conjunctivae are normal. No scleral icterus.  Neck: Neck supple.  Cardiovascular: Normal rate, regular rhythm and normal heart sounds.   Pulmonary/Chest: Effort normal and breath sounds normal. Right breast exhibits no inverted nipple, no mass, no nipple discharge, no skin change and no tenderness. Left breast exhibits no inverted nipple, no mass, no nipple discharge, no skin change and no tenderness.    Well healed incision at 3 o'clock left breast.  Lymphadenopathy:    She has no cervical adenopathy.    She has no axillary adenopathy.  Neurological: She is alert and oriented to person, place, and time.  Skin: Skin is warm and dry.  Psychiatric: Her behavior is normal.    Data Reviewed December 2016 mammogram reviewed. BI-RADS-2.  Assessment    Benign breast exam. Now 3 years into tamoxifen therapy.    Plan    Plans are to continue  tamoxifen for 5 years.      The patient has been asked to return to the office in one year with a bilateral diagnostic mammogram ( Dec).   PCP:  No Pcp Per Patient GYN: Sara Wood This information has been scribed by Karie Fetch RN, BSN,BC.   Robert Bellow 02/14/2016, 2:07 PM

## 2016-02-28 ENCOUNTER — Other Ambulatory Visit: Payer: Self-pay | Admitting: *Deleted

## 2016-02-28 DIAGNOSIS — D0512 Intraductal carcinoma in situ of left breast: Secondary | ICD-10-CM

## 2016-02-28 MED ORDER — TAMOXIFEN CITRATE 20 MG PO TABS: 20.0000 mg | ORAL_TABLET | Freq: Every day | ORAL | Status: DC

## 2016-06-10 ENCOUNTER — Other Ambulatory Visit: Payer: Self-pay

## 2016-06-10 DIAGNOSIS — D0512 Intraductal carcinoma in situ of left breast: Secondary | ICD-10-CM

## 2016-08-28 ENCOUNTER — Ambulatory Visit: Payer: BC Managed Care – PPO

## 2016-08-28 ENCOUNTER — Other Ambulatory Visit: Payer: BC Managed Care – PPO

## 2016-09-03 ENCOUNTER — Ambulatory Visit: Payer: BC Managed Care – PPO | Admitting: General Surgery

## 2016-09-08 ENCOUNTER — Ambulatory Visit
Admission: RE | Admit: 2016-09-08 | Discharge: 2016-09-08 | Disposition: A | Discharge status: Home / Self Care | Payer: BC Managed Care – PPO | Source: Ambulatory Visit | Attending: General Surgery | Admitting: General Surgery

## 2016-09-08 ENCOUNTER — Other Ambulatory Visit: Payer: Self-pay | Admitting: General Surgery

## 2016-09-08 DIAGNOSIS — Z853 Personal history of malignant neoplasm of breast: Secondary | ICD-10-CM | POA: Diagnosis present

## 2016-09-08 DIAGNOSIS — D0512 Intraductal carcinoma in situ of left breast: Secondary | ICD-10-CM

## 2016-09-08 HISTORY — DX: Personal history of irradiation: Z92.3

## 2016-11-27 ENCOUNTER — Ambulatory Visit: Payer: BC Managed Care – PPO | Admitting: General Surgery

## 2017-02-17 ENCOUNTER — Ambulatory Visit: Payer: BC Managed Care – PPO | Admitting: General Surgery

## 2017-02-19 ENCOUNTER — Encounter: Payer: Self-pay | Admitting: General Surgery

## 2017-02-19 ENCOUNTER — Ambulatory Visit (INDEPENDENT_AMBULATORY_CARE_PROVIDER_SITE_OTHER): Payer: BC Managed Care – PPO | Admitting: General Surgery

## 2017-02-19 VITALS — BP 140/80 | HR 78 | Resp 12 | Ht 64.0 in | Wt 213.0 lb

## 2017-02-19 DIAGNOSIS — D0512 Intraductal carcinoma in situ of left breast: Secondary | ICD-10-CM | POA: Diagnosis not present

## 2017-02-19 NOTE — Progress Notes (Signed)
Patient ID: Sara Wood, female   DOB: [birth year], 51 y.o.   MRN: [id number]  Chief Complaint  Patient presents with  . Follow-up    HPI Sara Wood is a 51 y.o. female who presents for a breast evaluation. The most recent mammogram was done on 08/28/2016 .  Patient does perform regular self breast checks and gets regular mammograms done.  Tolerating the tamoxifen.  HPI  Past Medical History:  Diagnosis Date  . Abnormal mammogram, unspecified 2013  . Breast cancer ([place]) 07/2012   left breast  . Breast screening, unspecified 2013  . Diabetes mellitus without complication ([place]) 4235  . Malignant neoplasm of lower-outer quadrant of female breast ([place]) 08/24/2012   Intermediate grade DCIS, ER 90%, PR 90% breast radiation.  . Mammographic microcalcification 2013  . Obesity, unspecified 2013  . Personal history of radiation therapy   . Radiation 10/05/12   treatment start date for 24 treatments    Past Surgical History:  Procedure Laterality Date  . BREAST BIOPSY Left 08/02/2012   positive, stereo  . BREAST EXCISIONAL BIOPSY Left 08/24/2012   radiation  . BREAST SURGERY Left 08/24/2012   Wide excision left breast DCIS  . BREAST SURGERY  09/03/2012   Re excision of both medial and lateral margins on left breast  . CESAREAN SECTION  1991,1995,2000  . TUBAL LIGATION      Family History  Problem Relation Age of Onset  . Colon polyps Father   . Breast cancer Neg Hx     Social History Social History  Substance Use Topics  . Smoking status: Never Smoker  . Smokeless tobacco: Never Used  . Alcohol use No    No Known Allergies  Current Outpatient Prescriptions  Medication Sig Dispense Refill  . aspirin 81 MG tablet Take 81 mg by mouth daily.    . metFORMIN (GLUCOPHAGE) 500 MG tablet Take 500 mg by mouth 2 (two) times daily with a meal.    . tamoxifen (NOLVADEX) 20 MG tablet Take 1 tablet (20 mg total) by mouth daily. 90 tablet 4   No current facility-administered  medications for this visit.     Review of Systems Review of Systems  Constitutional: Negative.   Respiratory: Negative.   Cardiovascular: Negative.     Blood pressure 140/80, pulse 78, resp. rate 12, height 5\' 4"  (1.626 m), weight 213 lb (96.6 kg).  Physical Exam Physical Exam  Constitutional: She is oriented to person, place, and time. She appears well-developed and well-nourished.  Eyes: Conjunctivae are normal. No scleral icterus.  Neck: Neck supple.  Cardiovascular: Normal rate, regular rhythm and normal heart sounds.   Pulmonary/Chest: Effort normal and breath sounds normal. Right breast exhibits no inverted nipple, no mass, no nipple discharge, no skin change and no tenderness. Left breast exhibits no inverted nipple, no mass, no nipple discharge, no skin change and no tenderness.    Left breast incision is clean and well healed.   Lymphadenopathy:    She has no cervical adenopathy.    She has no axillary adenopathy.  Neurological: She is alert and oriented to person, place, and time.  Skin: Skin is warm and dry.    Data Reviewed Bilateral diagnostic mammograms of 09/08/2016 were independently reviewed. RECOMMENDATION: Bilateral diagnostic mammogram in 1 year.  I have discussed the findings and recommendations with the patient. Results were also provided in writing at the conclusion of the visit. If applicable, a reminder letter will be sent to the patient regarding the  next appointment.  BI-RADS CATEGORY  2: Benign.  Assessment    No evidence of recurrent breast cancer.  Candidate for screening colonoscopy.    Plan    Soon to complete complete 5 years of adjuvant hormone suppressive.     Patient to return in six months for breast check with bilateral diagnostic mammograms at that time..   The patient is aware to call back for any questions or concerns.  The patient reports that Gaylyn Cheers, M.D. has completed her husband colonoscopy. She has been  encouraged to contact his office for scheduling of a screening colonoscopy based on her family history and age.  HPI, Physical Exam, Assessment and Plan have been scribed under the direction and in the presence of Hervey Ard, MD.  Gaspar Cola, CMA  I have completed the exam and reviewed the above documentation for accuracy and completeness.  I agree with the above.  Haematologist has been used and any errors in dictation or transcription are unintentional.  Hervey Ard, M.D., F.A.C.S.  Robert Bellow 02/20/2017, 8:10 PM

## 2017-02-19 NOTE — Patient Instructions (Addendum)
Patient to return in six months breast check.

## 2017-03-11 ENCOUNTER — Other Ambulatory Visit: Payer: Self-pay | Admitting: General Surgery

## 2017-03-11 DIAGNOSIS — D0512 Intraductal carcinoma in situ of left breast: Secondary | ICD-10-CM

## 2017-03-16 ENCOUNTER — Other Ambulatory Visit: Payer: Self-pay | Admitting: *Deleted

## 2017-03-16 MED ORDER — TAMOXIFEN CITRATE 20 MG PO TABS: 20.0000 mg | ORAL_TABLET | Freq: Every day | ORAL | 3 refills | Status: DC

## 2017-07-06 ENCOUNTER — Encounter: Payer: Self-pay | Admitting: Advanced Practice Midwife

## 2017-07-06 ENCOUNTER — Ambulatory Visit (INDEPENDENT_AMBULATORY_CARE_PROVIDER_SITE_OTHER): Payer: BC Managed Care – PPO | Admitting: Advanced Practice Midwife

## 2017-07-06 VITALS — BP 124/80 | Ht 64.0 in | Wt 210.0 lb

## 2017-07-06 DIAGNOSIS — Z124 Encounter for screening for malignant neoplasm of cervix: Secondary | ICD-10-CM | POA: Diagnosis not present

## 2017-07-06 DIAGNOSIS — Z01419 Encounter for gynecological examination (general) (routine) without abnormal findings: Secondary | ICD-10-CM

## 2017-07-06 NOTE — Progress Notes (Signed)
Patient ID: Sara Wood, female   DOB: [birth year], 51 y.o.   MRN: [id number]     Gynecology Annual Exam  PCP: Abran Richard, MD  Chief Complaint:  Chief Complaint  Patient presents with  . Annual Exam    History of Present Illness:Patient is a 51 y.o. G3P0 presents for annual exam. The patient has no gyn complaints today. She has had a few episodes of vertigo with nausea lasting for a day in the past 4 months. She has been on Tamoxifen for ongoing treatment of breast cancer since 2013. She is due to stop taking the medication next year. She has not had a period since she has been on the Tamoxifen and is wondering about the possibility of having a period once she stops taking it. Discussion of average age of menopause and uncertainty if bleeding will resume when she stops medication.   LMP: No LMP recorded. Patient is not currently having periods (Reason: Chemotherapy). Menarche:not applicable Postcoital Bleeding: no Dysmenorrhea: not applicable   The patient is sexually active. She denies dyspareunia.  The patient does perform self breast exams.  There is no notable family history of breast or ovarian cancer in her family. She had surgery and radiation for Stage 0 cancer in left breast in 2013.   The patient wears seatbelts: yes.   The patient has regular exercise: She is active at work but denies cardio activity. She admits increased intake of sugar in her diet.  The patient denies current symptoms of depression.     Review of Systems: Review of Systems  Constitutional: Negative.   HENT: Negative.   Eyes: Negative.   Respiratory: Negative.   Cardiovascular: Negative.   Gastrointestinal: Negative.   Genitourinary: Negative.   Musculoskeletal: Negative.   Skin: Negative.   Neurological: Negative.   Endo/Heme/Allergies: Negative.   Psychiatric/Behavioral: Negative.     Past Medical History:  Past Medical History:  Diagnosis Date  . Abnormal mammogram, unspecified  2013  . Breast cancer ([place]) 07/2012   left breast  . Breast screening, unspecified 2013  . Diabetes mellitus without complication ([place]) 4098  . 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  . Mammographic microcalcification 2013  . Obesity, unspecified 2013  . Personal history of radiation therapy   . Radiation 10/05/12   treatment start date for 24 treatments    Past Surgical History:  Past Surgical History:  Procedure Laterality Date  . BREAST BIOPSY Left 08/02/2012   positive, stereo  . BREAST EXCISIONAL BIOPSY Left 08/24/2012   radiation  . BREAST SURGERY Left 08/24/2012   Wide excision left breast DCIS  . BREAST SURGERY  09/03/2012   Re excision of both medial and lateral margins on left breast  . CESAREAN SECTION  1991,1995,2000  . TUBAL LIGATION      Gynecologic History:  No LMP recorded. Patient is not currently having periods (Reason: Chemotherapy). Last Pap: 3 years ago Results were:  no abnormalities  Last mammogram: 10 months ago Results were: BI-RAD I Obstetric History: G3P0  Family History:  Family History  Problem Relation Age of Onset  . Colon polyps Father   . Breast cancer Neg Hx     Social History:  Social History   Socioeconomic History  . Marital status: Married    Spouse name: Not on file  . Number of children: Not on file  . Years of education: Not on file  .  Highest education level: Not on file  Social Needs  . Financial resource strain: Not on file  . Food insecurity - worry: Not on file  . Food insecurity - inability: Not on file  . Transportation needs - medical: Not on file  . Transportation needs - non-medical: Not on file  Occupational History  . Not on file  Tobacco Use  . Smoking status: Never Smoker  . Smokeless tobacco: Never Used  Substance and Sexual Activity  . Alcohol use: No  . Drug use: No  . Sexual activity: Not on file    Other Topics Concern  . Not on file  Social History Narrative  . Not on file    Allergies:  No Known Allergies  Medications: Prior to Admission medications   Medication Sig Start Date End Date Taking? Authorizing Provider  aspirin 81 MG tablet Take 81 mg by mouth daily.   Yes [provider]  metFORMIN (GLUCOPHAGE) 500 MG tablet Take 500 mg by mouth 2 (two) times daily with a meal.   Yes [provider]  tamoxifen (NOLVADEX) 20 MG tablet Take 1 tablet (20 mg total) by mouth daily. 03/16/17  Yes Byrnett, Forest Gleason, MD    Physical Exam Vitals: Blood pressure 124/80, height 5\' 4"  (1.626 m), weight 210 lb (95.3 kg).  General: NAD HEENT: normocephalic, anicteric Thyroid: no enlargement, no palpable nodules Pulmonary: No increased work of breathing, CTAB Cardiovascular: RRR, distal pulses 2+ Breast: Breast symmetrical, no tenderness, no palpable nodules or masses, no skin or nipple retraction present, no nipple discharge.  No axillary or supraclavicular lymphadenopathy. Abdomen: NABS, soft, non-tender, non-distended.  Umbilicus without lesions.  No hepatomegaly, splenomegaly or masses palpable. No evidence of hernia  Genitourinary:  External: Normal external female genitalia.  Normal urethral meatus, normal  Bartholin's and Skene's glands.    Vagina: Normal vaginal mucosa, no evidence of prolapse.    Cervix: Grossly normal in appearance, no bleeding, no CMT  Uterus: Non-enlarged, mobile, normal contour.    Adnexa: ovaries non-enlarged, no adnexal masses  Rectal: deferred  Lymphatic: no evidence of inguinal lymphadenopathy Extremities: no edema, erythema, or tenderness Neurologic: Grossly intact Psychiatric: mood appropriate, affect full    Assessment: 51 y.o. G3P0 routine annual exam  Plan: Problem List Items Addressed This Visit    None    Visit Diagnoses    Well woman exam with routine gynecological exam    -  Primary   Relevant Orders   IGP, Aptima HPV    Cervical cancer screening       Relevant Orders   IGP, Aptima HPV      1) Mammogram - recommend yearly screening mammogram.  Mammogram Is up to date  2) STI screening was offered and declined  3) ASCCP guidelines and rational discussed.  Patient opts for every 3 years screening interval  4) Osteoporosis  - per USPTF routine screening DEXA at age 53   Consider FDA-approved medical therapies in postmenopausal women and men aged 45 years and older, based on the following: a) A hip or vertebral (clinical or morphometric) fracture b) T-score ? -2.5 at the femoral neck or spine after appropriate evaluation to exclude secondary causes C) Low bone mass (T-score between -1.0 and -2.5 at the femoral neck or spine) and a 10-year probability of a hip fracture ? 3% or a 10-year probability of a major osteoporosis-related fracture ? 20% based on the [place]-adapted WHO algorithm   5) Routine healthcare maintenance including cholesterol, diabetes screening discussed managed  by PCP  6) Colonoscopy: patient may schedule next summer.  Screening recommended starting at age 35 for average risk individuals, age 67 for individuals deemed at increased risk (including African Americans) and recommended to continue until age 54.  For patient age 68-85 individualized approach is recommended.  Gold standard screening is via colonoscopy, Cologuard screening is an acceptable alternative for patient unwilling or unable to undergo colonoscopy.  "Colorectal cancer screening for average?risk adults: 2018 guideline update from the American Cancer Society"[place]: A Cancer Journal for Clinicians: Jan 21, 2017   7) Increase healthy lifestyle diet/cardio activity  8) Follow up 1 year for routine annual  Rod Can, [place]

## 2017-07-06 NOTE — Patient Instructions (Signed)
Health Maintenance, Female Adopting a healthy lifestyle and getting preventive care can go a long way to promote health and wellness. Talk with your health care provider about what schedule of regular examinations is right for you. This is a good chance for you to check in with your provider about disease prevention and staying healthy. In between checkups, there are plenty of things you can do on your own. Experts have done a lot of research about which lifestyle changes and preventive measures are most likely to keep you healthy. Ask your health care provider for more information. Weight and diet Eat a healthy diet  Be sure to include plenty of vegetables, fruits, low-fat dairy products, and lean protein.  Do not eat a lot of foods high in solid fats, added sugars, or salt.  Get regular exercise. This is one of the most important things you can do for your health. ? Most adults should exercise for at least 150 minutes each week. The exercise should increase your heart rate and make you sweat (moderate-intensity exercise). ? Most adults should also do strengthening exercises at least twice a week. This is in addition to the moderate-intensity exercise.  Maintain a healthy weight  Body mass index (BMI) is a measurement that can be used to identify possible weight problems. It estimates body fat based on height and weight. Your health care provider can help determine your BMI and help you achieve or maintain a healthy weight.  For females 50 years of age and older: ? A BMI below 18.5 is considered underweight. ? A BMI of 18.5 to 24.9 is normal. ? A BMI of 25 to 29.9 is considered overweight. ? A BMI of 30 and above is considered obese.  Watch levels of cholesterol and blood lipids  You should start having your blood tested for lipids and cholesterol at 50 years of age, then have this test every 5 years. your blood tested for lipids and cholesterol at 50 years of age, then have this test every 5 years.  You may need to have your cholesterol levels checked more often if: ? Your lipid or  cholesterol levels are high. ? You are older than 50 years of age. years of age. ? You are at high risk for heart disease.  Cancer screening Lung Cancer  Lung cancer screening is recommended for adults 50-27 years old who are at high risk for lung cancer because of a history of smoking.  A yearly low-dose CT scan of the lungs is recommended for people who: ? Currently smoke. ? Have quit within the past 15 years. ? Have at least a 30-pack-year history of smoking. A pack year is smoking an average of one pack of cigarettes a day for 1 year.  Yearly screening should continue until it has been 15 years since you quit.  Yearly screening should stop if you develop a health problem that would prevent you from having lung cancer treatment.  Breast Cancer  Practice breast self-awareness. This means understanding how your breasts normally appear and feel.  It also means doing regular breast self-exams. Let your health care provider know about any changes, no matter how small.  If you are in your 20s or 30s, you should have a If you are in your 20s or 30s, you should have a clinical breast exam (CBE) by a health care provider every 1-3 years as part of a regular health exam. by a health care provider every 1-3 years as part of a regular health exam.  If you are 50 or older, have a CBE every year. have a CBE every year. Also consider having a breast X-ray (mammogram) every year.  If you have a family history of breast cancer, talk to your health care provider about genetic screening.  If you are at high risk  for breast cancer, talk to your health care provider about having an MRI and a mammogram every year.  Breast cancer gene (BRCA) assessment is recommended for women who have family members with BRCA-related cancers. BRCA-related cancers include: ? Breast. ? Ovarian. ? Tubal. ? Peritoneal cancers.  Results of the assessment will determine the need for genetic counseling and BRCA1 and BRCA2 testing.  Cervical Cancer Your health care provider may recommend that you be screened regularly for cancer of the pelvic organs (ovaries, uterus, and  vagina). This screening involves a pelvic examination, including checking for microscopic changes to the surface of your cervix (Pap test). You may be encouraged to have this screening done every 3 years, beginning at age 50.  For women ages 50-65, health care providers may recommend pelvic exams and Pap testing every 3 years, or they may recommend the Pap and pelvic exam, combined with testing for human papilloma virus (HPV), every 5 years. Some types of HPV increase your risk of cervical cancer. Testing for HPV may also be done on women of any age with unclear Pap test results.  Other health care providers may not recommend any screening for nonpregnant women who are considered low risk for pelvic cancer and who do not have symptoms. Ask your health care provider if a screening pelvic exam is right for you.  If you have had past treatment for cervical cancer or a condition that could lead to cancer, you need Pap tests and screening for cancer for at least 20 years after your treatment. If Pap tests have been discontinued, your risk factors (such as having a new sexual partner) need to be reassessed to determine if screening should resume. Some women have medical problems that increase the chance of getting cervical cancer. In these cases, your health care provider may recommend more frequent screening and Pap tests.  Colorectal Cancer  This type of cancer can be detected and often prevented.  Routine colorectal cancer screening usually begins at 50 years of age and continues through 50 years of age.  Your health care provider may recommend screening at an earlier age if you have risk factors for colon cancer.  Your health care provider may also recommend using home test kits to check for hidden blood in the stool.  A small camera at the end of a tube can be used to examine your colon directly (sigmoidoscopy or colonoscopy). This is done to check for the earliest forms of colorectal  cancer.  Routine screening usually begins at age 50.  Direct examination of the colon should be repeated every 5-10 years through 50 years of age. However, you may need to be screened more often if early forms of precancerous polyps or small growths are found.  Skin Cancer  Check your skin from head to toe regularly.  Tell your health care provider about any new moles or changes in moles, especially if there is a change in a mole's shape or color.  Also tell your health care provider if you have a mole that is larger than the size of a pencil eraser.  Always use sunscreen. Apply sunscreen liberally and repeatedly throughout the day.  Protect yourself by wearing long sleeves, pants, a wide-brimmed hat, and sunglasses whenever you are outside.  Heart disease, diabetes, and high blood pressure  High blood pressure causes heart disease and increases the risk of stroke. High blood pressure is more likely to develop in: ? People who have blood pressure in the high end of  the normal range (130-139/85-89 mm Hg). ? People who are overweight or obese. ? People who are African American.  If you are 21-29 years of age, have your blood pressure checked every 3-5 years. If you are 3 years of age or older, have your blood pressure checked every year. You should have your blood pressure measured twice-once when you are at a hospital or clinic, and once when you are not at a hospital or clinic. Record the average of the two measurements. To check your blood pressure when you are not at a hospital or clinic, you can use: ? An automated blood pressure machine at a pharmacy. ? A home blood pressure monitor.  If you are between 17 years and 37 years old, ask your health care provider if you should take aspirin to prevent strokes.  Have regular diabetes screenings. This involves taking a blood sample to check your fasting blood sugar level. ? If you are at a normal weight and have a low risk for diabetes,  have this test once every three years after 50 years of age. ? If you are overweight and have a high risk for diabetes, consider being tested at a younger age or more often. Preventing infection Hepatitis B  If you have a higher risk for hepatitis B, you should be screened for this virus. You are considered at high risk for hepatitis B if: ? You were born in a country where hepatitis B is common. Ask your health care provider which countries are considered high risk. ? Your parents were born in a high-risk country, and you have not been immunized against hepatitis B (hepatitis B vaccine). ? You have HIV or AIDS. ? You use needles to inject street drugs. ? You live with someone who has hepatitis B. ? You have had sex with someone who has hepatitis B. ? You get hemodialysis treatment. ? You take certain medicines for conditions, including cancer, organ transplantation, and autoimmune conditions.  Hepatitis C  Blood testing is recommended for: ? Everyone born from [birth year] through 1964. ? Anyone with known risk factors for hepatitis C.  Sexually transmitted infections (STIs)  You should be screened for sexually transmitted infections (STIs) including gonorrhea and chlamydia if: ? You are sexually active and are younger than 50 years of age. ? You are older than 50 years of age and your health care provider tells you that you are at risk for this type of infection. ? Your sexual activity has changed since you were last screened and you are at an increased risk for chlamydia or gonorrhea. Ask your health care provider if you are at risk.  If you do not have HIV, but are at risk, it may be recommended that you take a prescription medicine daily to prevent HIV infection. This is called pre-exposure prophylaxis (PrEP). You are considered at risk if: ? You are sexually active and do not regularly use condoms or know the HIV status of your partner(s). ? You take drugs by injection. ? You are  sexually active with a partner who has HIV.  Talk with your health care provider about whether you are at high risk of being infected with HIV. If you choose to begin PrEP, you should first be tested for HIV. You should then be tested every 3 months for as long as you are taking PrEP. Pregnancy  If you are premenopausal and you may become pregnant, ask your health care provider about preconception counseling.  If you may become  pregnant, take 400 to 800 micrograms (mcg) of folic acid every day.  If you want to prevent pregnancy, talk to your health care provider about birth control (contraception). Osteoporosis and menopause  Osteoporosis is a disease in which the bones lose minerals and strength with aging. This can result in serious bone fractures. Your risk for osteoporosis can be identified using a bone density scan.  If you are 51 years of age or older, or if you are at risk for osteoporosis and fractures, ask your health care provider if you should be screened.  Ask your health care provider whether you should take a calcium or vitamin D supplement to lower your risk for osteoporosis.  Menopause may have certain physical symptoms and risks.  Hormone replacement therapy may reduce some of these symptoms and risks. Talk to your health care provider about whether hormone replacement therapy is right for you. Follow these instructions at home:  Schedule regular health, dental, and eye exams.  Stay current with your immunizations.  Do not use any tobacco products including cigarettes, chewing tobacco, or electronic cigarettes.  If you are pregnant, do not drink alcohol.  If you are breastfeeding, limit how much and how often you drink alcohol.  Limit alcohol intake to no more than 1 drink per day for nonpregnant women. One drink equals 12 ounces of beer, 5 ounces of wine, or 1 ounces of hard liquor.  Do not use street drugs.  Do not share needles.  Ask your health care  provider for help if you need support or information about quitting drugs.  Tell your health care provider if you often feel depressed.  Tell your health care provider if you have ever been abused or do not feel safe at home. This information is not intended to replace advice given to you by your health care provider. Make sure you discuss any questions you have with your health care provider. Document Released: 02/24/2011 Document Revised: 01/17/2016 Document Reviewed: 05/15/2015 Elsevier Interactive Patient Education  2018 [employer].   Colonoscopy, Adult A colonoscopy is an exam to look at the entire large intestine. During the exam, a lubricated, bendable tube is inserted into the anus and then passed into the rectum, colon, and other parts of the large intestine. A colonoscopy is often done as a part of normal colorectal screening or in response to certain symptoms, such as anemia, persistent diarrhea, abdominal pain, and blood in the stool. The exam can help screen for and diagnose medical problems, including:  Tumors.  Polyps.  Inflammation.  Areas of bleeding.  Tell a health care provider about:  Any allergies you have.  All medicines you are taking, including vitamins, herbs, eye drops, creams, and over-the-counter medicines.  Any problems you or family members have had with anesthetic medicines.  Any blood disorders you have.  Any surgeries you have had.  Any medical conditions you have.  Any problems you have had passing stool. What are the risks? Generally, this is a safe procedure. However, problems may occur, including:  Bleeding.  A tear in the intestine.  A reaction to medicines given during the exam.  Infection (rare).  What happens before the procedure? Eating and drinking restrictions Follow instructions from your health care provider about eating and drinking, which may include:  A few days before the procedure - follow a low-fiber diet.  Avoid nuts, seeds, dried fruit, raw fruits, and vegetables.  1-3 days before the procedure - follow a clear liquid diet. Drink only  clear liquids, such as clear broth or bouillon, black coffee or tea, clear juice, clear soft drinks or sports drinks, gelatin dessert, and popsicles. Avoid any liquids that contain red or purple dye.  On the day of the procedure - do not eat or drink anything during the 2 hours before the procedure, or within the time period that your health care provider recommends.  Bowel prep If you were prescribed an oral bowel prep to clean out your colon:  Take it as told by your health care provider. Starting the day before your procedure, you will need to drink a large amount of medicated liquid. The liquid will cause you to have multiple loose stools until your stool is almost clear or light green.  If your skin or anus gets irritated from diarrhea, you may use these to relieve the irritation: ? Medicated wipes, such as adult wet wipes with aloe and vitamin E. ? A skin soothing-product like petroleum jelly.  If you vomit while drinking the bowel prep, take a break for up to 60 minutes and then begin the bowel prep again. If vomiting continues and you cannot take the bowel prep without vomiting, call your health care provider.  General instructions  Ask your health care provider about changing or stopping your regular medicines. This is especially important if you are taking diabetes medicines or blood thinners.  Plan to have someone take you home from the hospital or clinic. What happens during the procedure?  An IV tube may be inserted into one of your veins.  You will be given medicine to help you relax (sedative).  To reduce your risk of infection: ? Your health care team will wash or sanitize their hands. ? Your anal area will be washed with soap.  You will be asked to lie on your side with your knees bent.  Your health care provider will lubricate a long,  thin, flexible tube. The tube will have a camera and a light on the end.  The tube will be inserted into your anus.  The tube will be gently eased through your rectum and colon.  Air will be delivered into your colon to keep it open. You may feel some pressure or cramping.  The camera will be used to take images during the procedure.  A small tissue sample may be removed from your body to be examined under a microscope (biopsy). If any potential problems are found, the tissue will be sent to a lab for testing.  If small polyps are found, your health care provider may remove them and have them checked for cancer cells.  The tube that was inserted into your anus will be slowly removed. The procedure may vary among health care providers and hospitals. What happens after the procedure?  Your blood pressure, heart rate, breathing rate, and blood oxygen level will be monitored until the medicines you were given have worn off.  Do not drive for 24 hours after the exam.  You may have a small amount of blood in your stool.  You may pass gas and have mild abdominal cramping or bloating due to the air that was used to inflate your colon during the exam.  It is up to you to get the results of your procedure. Ask your health care provider, or the department performing the procedure, when your results will be ready. This information is not intended to replace advice given to you by your health care provider. Make sure you discuss any questions  you have with your health care provider. Document Released: 08/08/2000 Document Revised: 06/11/2016 Document Reviewed: 10/23/2015 Elsevier Interactive Patient Education  2018 [employer].     Why follow it? Research shows. . Those who follow the Mediterranean diet have a reduced risk of heart disease  . The diet is associated with a reduced incidence of Parkinson's and Alzheimer's diseases . People following the diet may have longer life expectancies and  lower rates of chronic diseases  . The Dietary Guidelines for Americans recommends the Mediterranean diet as an eating plan to promote health and prevent disease  What Is the Mediterranean Diet?  . Healthy eating plan based on typical foods and recipes of Mediterranean-style cooking . The diet is primarily a plant based diet; these foods should make up a majority of meals   Starches - Plant based foods should make up a majority of meals - They are an important sources of vitamins, minerals, energy, antioxidants, and fiber - Choose whole grains, foods high in fiber and minimally processed items  - Typical grain sources include wheat, oats, barley, corn, brown rice, bulgar, farro, millet, polenta, couscous  - Various types of beans include chickpeas, lentils, fava beans, black beans, white beans   Fruits  Veggies - Large quantities of antioxidant rich fruits & veggies; 6 or more servings  - Vegetables can be eaten raw or lightly drizzled with oil and cooked  - Vegetables common to the traditional Mediterranean Diet include: artichokes, arugula, beets, broccoli, brussel sprouts, cabbage, carrots, celery, collard greens, cucumbers, eggplant, kale, leeks, lemons, lettuce, mushrooms, okra, onions, peas, peppers, potatoes, pumpkin, radishes, rutabaga, shallots, spinach, sweet potatoes, turnips, zucchini - Fruits common to the Mediterranean Diet include: apples, apricots, avocados, cherries, clementines, dates, figs, grapefruits, grapes, melons, nectarines, oranges, peaches, pears, pomegranates, strawberries, tangerines  Fats - Replace butter and margarine with healthy oils, such as olive oil, canola oil, and tahini  - Limit nuts to no more than a handful a day  - Nuts include walnuts, almonds, pecans, pistachios, pine nuts  - Limit or avoid candied, honey roasted or heavily salted nuts - Olives are central to the [employer] - can be eaten whole or used in a variety of dishes   Meats Protein -  Limiting red meat: no more than a few times a month - When eating red meat: choose lean cuts and keep the portion to the size of deck of cards - Eggs: approx. 0 to 4 times a week  - Fish and lean poultry: at least 2 a week  - Healthy protein sources include, chicken, [place], lean beef, lamb - Increase intake of seafood such as tuna, salmon, trout, mackerel, shrimp, scallops - Avoid or limit high fat processed meats such as sausage and bacon  Dairy - Include moderate amounts of low fat dairy products  - Focus on healthy dairy such as fat free yogurt, skim milk, low or reduced fat cheese - Limit dairy products higher in fat such as whole or 2% milk, cheese, ice cream  Alcohol - Moderate amounts of red wine is ok  - No more than 5 oz daily for women (all ages) and men older than age 58  - No more than 10 oz of wine daily for men younger than 41  Other - Limit sweets and other desserts  - Use herbs and spices instead of salt to flavor foods  - Herbs and spices common to the traditional Mediterranean Diet include: basil, bay leaves, chives, cloves, cumin, fennel,  garlic, lavender, marjoram, mint, oregano, parsley, pepper, rosemary, sage, savory, sumac, tarragon, thyme   It's not just a diet, it's a lifestyle:  . The Mediterranean diet includes lifestyle factors typical of those in the region  . Foods, drinks and meals are best eaten with others and savored . Daily physical activity is important for overall good health . This could be strenuous exercise like running and aerobics . This could also be more leisurely activities such as walking, housework, yard-work, or taking the stairs . Moderation is the key; a balanced and healthy diet accommodates most foods and drinks . Consider portion sizes and frequency of consumption of certain foods   Meal Ideas & Options:  . Breakfast:  o Whole wheat toast or whole wheat English muffins with peanut butter & hard boiled egg o Steel cut oats topped with  apples & cinnamon and skim milk  o Fresh fruit: banana, strawberries, melon, berries, peaches  o Smoothies: strawberries, bananas, greek yogurt, peanut butter o Low fat greek yogurt with blueberries and granola  o Egg white omelet with spinach and mushrooms o Breakfast couscous: whole wheat couscous, apricots, skim milk, cranberries  . Sandwiches:  o Hummus and grilled vegetables (peppers, zucchini, squash) on whole wheat bread   o Grilled chicken on whole wheat pita with lettuce, tomatoes, cucumbers or tzatziki  o Tuna salad on whole wheat bread: tuna salad made with greek yogurt, olives, red peppers, capers, green onions o Garlic rosemary lamb pita: lamb sauted with garlic, rosemary, salt & pepper; add lettuce, cucumber, greek yogurt to pita - flavor with lemon juice and black pepper  . Seafood:  o Mediterranean grilled salmon, seasoned with garlic, basil, parsley, lemon juice and black pepper o Shrimp, lemon, and spinach whole-grain pasta salad made with low fat greek yogurt  o Seared scallops with lemon orzo  o Seared tuna steaks seasoned salt, pepper, coriander topped with tomato mixture of olives, tomatoes, olive oil, minced garlic, parsley, green onions and cappers  . Meats:  o Herbed greek chicken salad with kalamata olives, cucumber, feta  o Red bell peppers stuffed with spinach, bulgur, lean ground beef (or lentils) & topped with feta   o Kebabs: skewers of chicken, tomatoes, onions, zucchini, squash  o [place] burgers: made with red onions, mint, dill, lemon juice, feta cheese topped with roasted red peppers . Vegetarian o Cucumber salad: cucumbers, artichoke hearts, celery, red onion, feta cheese, tossed in olive oil & lemon juice  o Hummus and whole grain pita points with a greek salad (lettuce, tomato, feta, olives, cucumbers, red onion) o Lentil soup with celery, carrots made with vegetable broth, garlic, salt and pepper  o Tabouli salad: parsley, bulgur, mint, scallions,  cucumbers, tomato, radishes, lemon juice, olive oil, salt and pepper.      American Heart Association (AHA) Exercise Recommendation  Being physically active is important to prevent heart disease and stroke, the nation's No. 1and No. 5killers. To improve overall cardiovascular health, we suggest at least 150 minutes per week of moderate exercise or 75 minutes per week of vigorous exercise (or a combination of moderate and vigorous activity). Thirty minutes a day, five times a week is an easy goal to remember. You will also experience benefits even if you divide your time into two or three segments of 10 to 15 minutes per day.  For people who would benefit from lowering their blood pressure or cholesterol, we recommend 40 minutes of aerobic exercise of moderate to vigorous intensity three to  four times a week to lower the risk for heart attack and stroke.  Physical activity is anything that makes you move your body and burn calories.  This includes things like climbing stairs or playing sports. Aerobic exercises benefit your heart, and include walking, jogging, swimming or biking. Strength and stretching exercises are best for overall stamina and flexibility.  The simplest, positive change you can make to effectively improve your heart health is to start walking. It's enjoyable, free, easy, social and great exercise. A walking program is flexible and boasts high success rates because people can stick with it. It's easy for walking to become a regular and satisfying part of life.   For Overall Cardiovascular Health:  At least 30 minutes of moderate-intensity aerobic activity at least 5 days per week for a total of 150  OR   At least 25 minutes of vigorous aerobic activity at least 3 days per week for a total of 75 minutes; or a combination of moderate- and vigorous-intensity aerobic activity  AND   Moderate- to high-intensity muscle-strengthening activity at least 2 days per week for  additional health benefits.  For Lowering Blood Pressure and Cholesterol  An average 40 minutes of moderate- to vigorous-intensity aerobic activity 3 or 4 times per week  What if I can't make it to the time goal? Something is always better than nothing! And everyone has to start somewhere. Even if you've been sedentary for years, today is the day you can begin to make healthy changes in your life. If you don't think you'll make it for 30 or 40 minutes, set a reachable goal for today. You can work up toward your overall goal by increasing your time as you get stronger. Don't let all-or-nothing thinking rob you of doing what you can every day.  Source:http://www.heart.org

## 2017-07-08 LAB — IGP, APTIMA HPV
HPV Aptima: NEGATIVE
PAP SMEAR COMMENT: 0

## 2017-07-09 ENCOUNTER — Other Ambulatory Visit: Payer: Self-pay

## 2017-07-09 DIAGNOSIS — D0512 Intraductal carcinoma in situ of left breast: Secondary | ICD-10-CM

## 2017-09-09 ENCOUNTER — Other Ambulatory Visit: Payer: BC Managed Care – PPO

## 2017-09-14 ENCOUNTER — Ambulatory Visit
Admission: RE | Admit: 2017-09-14 | Discharge: 2017-09-14 | Disposition: A | Discharge status: Home / Self Care | Payer: BC Managed Care – PPO | Source: Ambulatory Visit | Attending: General Surgery | Admitting: General Surgery

## 2017-09-14 DIAGNOSIS — D0512 Intraductal carcinoma in situ of left breast: Secondary | ICD-10-CM | POA: Insufficient documentation

## 2017-09-14 DIAGNOSIS — Z9889 Other specified postprocedural states: Secondary | ICD-10-CM | POA: Diagnosis not present

## 2017-09-24 ENCOUNTER — Ambulatory Visit: Payer: BC Managed Care – PPO | Admitting: General Surgery

## 2017-10-18 ENCOUNTER — Other Ambulatory Visit: Payer: Self-pay

## 2017-10-18 ENCOUNTER — Encounter: Payer: Self-pay | Admitting: Gynecology

## 2017-10-18 ENCOUNTER — Ambulatory Visit
Admission: EM | Admit: 2017-10-18 | Discharge: 2017-10-18 | Disposition: A | Discharge status: Home / Self Care | Payer: BC Managed Care – PPO | Attending: Family Medicine | Admitting: Family Medicine

## 2017-10-18 DIAGNOSIS — B9789 Other viral agents as the cause of diseases classified elsewhere: Secondary | ICD-10-CM

## 2017-10-18 DIAGNOSIS — R05 Cough: Secondary | ICD-10-CM | POA: Diagnosis not present

## 2017-10-18 DIAGNOSIS — J069 Acute upper respiratory infection, unspecified: Secondary | ICD-10-CM | POA: Diagnosis not present

## 2017-10-18 MED ORDER — FLUTICASONE PROPIONATE 50 MCG/ACT NA SUSP: 2.0000 | Freq: Every day | NASAL | 2 refills | Status: DC

## 2017-10-18 MED ORDER — PSEUDOEPH-BROMPHEN-DM 30-2-10 MG/5ML PO SYRP: 5.0000 mL | ORAL_SOLUTION | Freq: Four times a day (QID) | ORAL | 0 refills | Status: DC | PRN

## 2017-10-18 NOTE — Discharge Instructions (Signed)
Please alternate Tylenol and ibuprofen as needed for any body aches, sore throat pain.  Please take Bromfed cough syrup as prescribed.  Use Flonase as prescribed.  Make sure you are drinking lots of fluids.  If any fevers above 101, shortness of breath, worsening cough return to the clinic.

## 2017-10-18 NOTE — ED Provider Notes (Signed)
[facility]    CSN: [id number] Arrival date & time: 10/18/17  6578     History   Chief Complaint Chief Complaint  Patient presents with  . Cough    HPI Sara Wood is a 52 y.o. female presents to the urgent care facility for evaluation of 2-day history of cough congestion runny nose and sinus pressure.  She denies any fevers, nausea vomiting or diarrhea.  No abdominal pain.  No rashes.  She has had mild body aches with no headaches.  Has had a mild throat.  Cough is nonproductive.  Patient has taken Tylenol, ibuprofen as well as Robitussin as needed with mild improvement.    HPI  Past Medical History:  Diagnosis Date  . Abnormal mammogram, unspecified 2013  . Breast cancer ([place]) 07/2012   left breast  . Breast screening, unspecified 2013  . Diabetes mellitus without complication ([place]) 4696  . 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  . Mammographic microcalcification 2013  . Obesity, unspecified 2013  . Personal history of radiation therapy   . Radiation 10/05/12   treatment start date for 24 treatments    Patient Active Problem List   Diagnosis Date Noted  . 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 BREAST BIOPSY Left 08/02/2012   positive, stereo  . BREAST EXCISIONAL BIOPSY Left 08/24/2012   radiation  . BREAST SURGERY Left 08/24/2012   Wide excision left breast DCIS  . BREAST SURGERY  09/03/2012   Re excision of both medial and lateral margins on left breast  . CESAREAN SECTION  1991,1995,2000  . TUBAL LIGATION      OB History    Gravida Para Term Preterm AB Living   3         3   SAB TAB Ectopic Multiple Live Births                  Obstetric Comments   Age first menstruation-14 Age first pregnancy-22 LMP-07/15/12       Home Medications    Prior to Admission medications     Medication Sig Start Date End Date Taking? Authorizing Provider  aspirin 81 MG tablet Take 81 mg by mouth daily.   Yes [provider]  metFORMIN (GLUCOPHAGE) 500 MG tablet Take 500 mg by mouth 2 (two) times daily with a meal.   Yes [provider]  tamoxifen (NOLVADEX) 20 MG tablet Take 1 tablet (20 mg total) by mouth daily. 03/16/17  Yes Byrnett, Forest Gleason, MD  brompheniramine-pseudoephedrine-DM 30-2-10 MG/5ML syrup Take 5 mLs by mouth 4 (four) times daily as needed. 10/18/17   Duanne Guess, PA-C  fluticasone (FLONASE) 50 MCG/ACT nasal spray Place 2 sprays into both nostrils daily. 10/18/17   Duanne Guess, PA-C    Family History Family History  Problem Relation Age of Onset  . Colon polyps Father   . Breast cancer Neg Hx     Social History Social History   Tobacco Use  . Smoking status: Never Smoker  . Smokeless tobacco: Never Used  Substance Use Topics  . Alcohol use: No  . Drug use: No     Allergies   Patient has no known allergies.   Review of Systems Review of Systems  Constitutional: Negative for fever.  HENT: Positive for congestion, rhinorrhea and sore throat. Negative for ear  discharge, sinus pressure, sinus pain, trouble swallowing and voice change.   Respiratory: Positive for cough. Negative for shortness of breath, wheezing and stridor.   Cardiovascular: Negative for chest pain.  Gastrointestinal: Negative for abdominal pain, diarrhea, nausea and vomiting.  Genitourinary: Negative for dysuria, flank pain and pelvic pain.  Musculoskeletal: Positive for myalgias. Negative for back pain.  Skin: Negative for rash.  Neurological: Negative for dizziness and headaches.     Physical Exam Triage Vital Signs ED Triage Vitals  Enc Vitals Group     BP 10/18/17 1008 (!) 154/83     Pulse Rate 10/18/17 1008 93     Resp 10/18/17 1008 18     Temp 10/18/17 1008 98.7 F (37.1 C)     Temp Source 10/18/17 1008 Oral     SpO2 10/18/17 1008 97 %      Weight 10/18/17 1010 212 lb (96.2 kg)     Height 10/18/17 1010 5\' 4"  (1.626 m)     Head Circumference --      Peak Flow --      Pain Score 10/18/17 1010 8     Pain Loc --      Pain Edu? --      Excl. in [place]? --    No data found.  Updated Vital Signs BP (!) 154/83 (BP Location: Left Arm)   Pulse 93   Temp 98.7 F (37.1 C) (Oral)   Resp 18   Ht 5\' 4"  (1.626 m)   Wt 212 lb (96.2 kg)   SpO2 97%   BMI 36.39 kg/m   Visual Acuity Right Eye Distance:   Left Eye Distance:   Bilateral Distance:    Right Eye Near:   Left Eye Near:    Bilateral Near:     Physical Exam  Constitutional: She is oriented to person, place, and time. She appears well-developed and well-nourished. No distress.  HENT:  Head: Normocephalic and atraumatic.  Right Ear: Hearing, tympanic membrane, external ear and ear canal normal.  Left Ear: Hearing, tympanic membrane, external ear and ear canal normal.  Nose: Rhinorrhea present.  Mouth/Throat: Oropharynx is clear and moist and mucous membranes are normal. No trismus in the jaw. No uvula swelling. No oropharyngeal exudate, posterior oropharyngeal edema, posterior oropharyngeal erythema or tonsillar abscesses. No tonsillar exudate.  Eyes: Conjunctivae are normal. Right eye exhibits no discharge. Left eye exhibits no discharge.  Neck: Normal range of motion.  Cardiovascular: Normal rate and regular rhythm.  Pulmonary/Chest: Effort normal and breath sounds normal. No stridor. No respiratory distress. She has no wheezes. She has no rales.  Abdominal: Soft. She exhibits no distension. There is no tenderness.  Musculoskeletal: Normal range of motion. She exhibits no deformity.  Lymphadenopathy:    She has cervical adenopathy.  Neurological: She is alert and oriented to person, place, and time. She has normal reflexes.  Skin: Skin is warm and dry.  Psychiatric: She has a normal mood and affect. Her behavior is normal. Thought content normal.     UC  Treatments / Results  Labs (all labs ordered are listed, but only abnormal results are displayed) Labs Reviewed - No data to display  EKG  EKG Interpretation None       Radiology No results found.  Procedures Procedures (including critical care time)  Medications Ordered in UC Medications - No data to display   Initial Impression / Assessment and Plan / UC Course  I have reviewed the triage vital signs and the nursing notes.  Pertinent labs & imaging results that were available during my care of the patient were reviewed by me and considered in my medical decision making (see chart for details).    52 year old female with viral upper respiratory infection times 2 days.  Lungs were clear to auscultation vital signs within normal limits.  No signs of any bacterial infection.  She is given Bromfed cough syrup with Flonase to help treat symptoms.  She will use Tylenol and ibuprofen as needed.  She is educated on signs and symptoms return to clinic for.  Final Clinical Impressions(s) / UC Diagnoses   Final diagnoses:  Viral URI with cough    ED Discharge Orders        Ordered    brompheniramine-pseudoephedrine-DM 30-2-10 MG/5ML syrup  4 times daily PRN     10/18/17 1023    fluticasone (FLONASE) 50 MCG/ACT nasal spray  Daily     10/18/17 1023       Duanne Guess, [place] 10/18/17 1028

## 2017-10-18 NOTE — ED Triage Notes (Signed)
Patient c/o cough/ nasal congestion x 2 days.

## 2017-10-21 ENCOUNTER — Telehealth: Payer: Self-pay | Admitting: Emergency Medicine

## 2017-10-21 NOTE — Telephone Encounter (Signed)
Called to follow up after patient's recent visit. LM to call with any questions or concerns.

## 2017-11-05 ENCOUNTER — Ambulatory Visit: Payer: BC Managed Care – PPO | Admitting: General Surgery

## 2018-01-11 ENCOUNTER — Other Ambulatory Visit: Payer: Self-pay

## 2018-01-11 DIAGNOSIS — D0512 Intraductal carcinoma in situ of left breast: Secondary | ICD-10-CM

## 2018-02-09 ENCOUNTER — Telehealth: Payer: Self-pay | Admitting: *Deleted

## 2018-02-09 NOTE — Telephone Encounter (Signed)
I talked with the patient, apparently there was a mix up in whether she was to continue follow up appointments here at the office. She thought that since she would be finishing her 5 years of tamoxifen therapy she wouldn'tt have to return, she also mentioned continuing the tamoxifen. If she stops the tamoxifen she would like to continue her mammograms with [facility] if that is ok with Dr Bary Castilla?

## 2018-02-09 NOTE — Telephone Encounter (Signed)
Patient called wanting you to call her back, I asked her what it was in regards to and she stated that she just needed to ask you a question.

## 2018-02-16 NOTE — Telephone Encounter (Signed)
No need for additional tamoxifen therapy. [place] for mammograms and breast exam by [facility] per pt request, pt agrees.

## 2018-03-18 ENCOUNTER — Other Ambulatory Visit: Payer: BC Managed Care – PPO

## 2018-03-18 ENCOUNTER — Ambulatory Visit: Payer: BC Managed Care – PPO

## 2018-04-06 ENCOUNTER — Telehealth: Payer: Self-pay

## 2018-04-06 ENCOUNTER — Other Ambulatory Visit: Payer: Self-pay | Admitting: Ophthalmology

## 2018-04-06 DIAGNOSIS — H4922 Sixth [abducent] nerve palsy, left eye: Secondary | ICD-10-CM

## 2018-04-06 NOTE — Telephone Encounter (Signed)
Pt wants to know if she can have labs done, her annual is due this fall. If you could order basic routine labs.. Please advise

## 2018-04-06 NOTE — Telephone Encounter (Signed)
Pt wants her sugar checked, also will she need to be fasting. blurred vision. Pt wants to be checked for diabetes . Pt will need to come Thursday if possible

## 2018-04-07 ENCOUNTER — Other Ambulatory Visit: Payer: Self-pay | Admitting: Obstetrics and Gynecology

## 2018-04-07 DIAGNOSIS — Z131 Encounter for screening for diabetes mellitus: Secondary | ICD-10-CM

## 2018-04-07 NOTE — Telephone Encounter (Signed)
Sending message to MD due to [place] out of office this week. Please advise SDJ

## 2018-04-07 NOTE — Telephone Encounter (Signed)
Patient is schedule 04/08/18 for Labs. Patient is very grateful.

## 2018-04-08 ENCOUNTER — Other Ambulatory Visit: Payer: BC Managed Care – PPO

## 2018-04-08 DIAGNOSIS — Z131 Encounter for screening for diabetes mellitus: Secondary | ICD-10-CM

## 2018-04-09 LAB — HGB A1C W/O EAG: HEMOGLOBIN A1C: 9.7 % — AB (ref 4.8–5.6)

## 2018-04-12 ENCOUNTER — Telehealth: Payer: Self-pay | Admitting: Advanced Practice Midwife

## 2018-04-12 ENCOUNTER — Other Ambulatory Visit: Payer: Self-pay | Admitting: Advanced Practice Midwife

## 2018-04-12 DIAGNOSIS — E1139 Type 2 diabetes mellitus with other diabetic ophthalmic complication: Secondary | ICD-10-CM

## 2018-04-12 MED ORDER — GLIPIZIDE 10 MG PO TABS: 10.0000 mg | ORAL_TABLET | Freq: Two times a day (BID) | ORAL | 3 refills | Status: DC

## 2018-04-12 NOTE — Telephone Encounter (Signed)
Patient is calling for labs results. Please advise. 

## 2018-04-12 NOTE — Telephone Encounter (Signed)
Thanks for getting her set up with labs!

## 2018-04-12 NOTE — Telephone Encounter (Signed)
Lab result reviewed with patient.

## 2018-04-12 NOTE — Progress Notes (Signed)
Rx Glipizide sent to patient pharmacy. Recommended lifestyle changes- diet and exercise.

## 2018-04-21 ENCOUNTER — Ambulatory Visit: Payer: BC Managed Care – PPO

## 2018-05-11 ENCOUNTER — Ambulatory Visit: Payer: BC Managed Care – PPO

## 2018-05-31 ENCOUNTER — Other Ambulatory Visit: Payer: Self-pay | Admitting: Advanced Practice Midwife

## 2018-05-31 DIAGNOSIS — Z1231 Encounter for screening mammogram for malignant neoplasm of breast: Secondary | ICD-10-CM

## 2018-07-21 ENCOUNTER — Other Ambulatory Visit: Payer: Self-pay | Admitting: Advanced Practice Midwife

## 2018-07-21 ENCOUNTER — Ambulatory Visit (INDEPENDENT_AMBULATORY_CARE_PROVIDER_SITE_OTHER): Payer: BC Managed Care – PPO | Admitting: Advanced Practice Midwife

## 2018-07-21 ENCOUNTER — Encounter: Payer: Self-pay | Admitting: Advanced Practice Midwife

## 2018-07-21 ENCOUNTER — Other Ambulatory Visit: Payer: Self-pay | Admitting: General Surgery

## 2018-07-21 ENCOUNTER — Telehealth: Payer: Self-pay | Admitting: Advanced Practice Midwife

## 2018-07-21 VITALS — BP 126/82 | HR 73 | Ht 64.0 in | Wt 202.0 lb

## 2018-07-21 DIAGNOSIS — E1139 Type 2 diabetes mellitus with other diabetic ophthalmic complication: Secondary | ICD-10-CM

## 2018-07-21 DIAGNOSIS — Z0000 Encounter for general adult medical examination without abnormal findings: Secondary | ICD-10-CM

## 2018-07-21 DIAGNOSIS — Z01419 Encounter for gynecological examination (general) (routine) without abnormal findings: Secondary | ICD-10-CM

## 2018-07-21 MED ORDER — GLIPIZIDE 10 MG PO TABS: 10.0000 mg | ORAL_TABLET | Freq: Two times a day (BID) | ORAL | 11 refills | Status: DC

## 2018-07-21 NOTE — Telephone Encounter (Signed)
advise

## 2018-07-21 NOTE — Patient Instructions (Signed)
Diabetes Mellitus and Nutrition When you have diabetes (diabetes mellitus), it is very important to have healthy eating habits because your blood sugar (glucose) levels are greatly affected by what you eat and drink. Eating healthy foods in the appropriate amounts, at about the same times every day, can help you:  Control your blood glucose.  Lower your risk of heart disease.  Improve your blood pressure.  Reach or maintain a healthy weight.  Every person with diabetes is different, and each person has different needs for a meal plan. Your health care provider may recommend that you work with a diet and nutrition specialist (dietitian) to make a meal plan that is best for you. Your meal plan may vary depending on factors such as:  The calories you need.  The medicines you take.  Your weight.  Your blood glucose, blood pressure, and cholesterol levels.  Your activity level.  Other health conditions you have, such as heart or kidney disease.  How do carbohydrates affect me? Carbohydrates affect your blood glucose level more than any other type of food. Eating carbohydrates naturally increases the amount of glucose in your blood. Carbohydrate counting is a method for keeping track of how many carbohydrates you eat. Counting carbohydrates is important to keep your blood glucose at a healthy level, especially if you use insulin or take certain oral diabetes medicines. It is important to know how many carbohydrates you can safely have in each meal. This is different for every person. Your dietitian can help you calculate how many carbohydrates you should have at each meal and for snack. Foods that contain carbohydrates include:  Bread, cereal, rice, pasta, and crackers.  Potatoes and corn.  Peas, beans, and lentils.  Milk and yogurt.  Fruit and juice.  Desserts, such as cakes, cookies, ice cream, and candy.  How does alcohol affect me? Alcohol can cause a sudden decrease in blood  glucose (hypoglycemia), especially if you use insulin or take certain oral diabetes medicines. Hypoglycemia can be a life-threatening condition. Symptoms of hypoglycemia (sleepiness, dizziness, and confusion) are similar to symptoms of having too much alcohol. If your health care provider says that alcohol is safe for you, follow these guidelines:  Limit alcohol intake to no more than 1 drink per day for nonpregnant women and 2 drinks per day for men. One drink equals 12 oz of beer, 5 oz of wine, or 1 oz of hard liquor.  Do not drink on an empty stomach.  Keep yourself hydrated with water, diet soda, or unsweetened iced tea.  Keep in mind that regular soda, juice, and other mixers may contain a lot of sugar and must be counted as carbohydrates.  What are tips for following this plan? Reading food labels  Start by checking the serving size on the label. The amount of calories, carbohydrates, fats, and other nutrients listed on the label are based on one serving of the food. Many foods contain more than one serving per package.  Check the total grams (g) of carbohydrates in one serving. You can calculate the number of servings of carbohydrates in one serving by dividing the total carbohydrates by 15. For example, if a food has 30 g of total carbohydrates, it would be equal to 2 servings of carbohydrates.  Check the number of grams (g) of saturated and trans fats in one serving. Choose foods that have low or no amount of these fats.  Check the number of milligrams (mg) of sodium in one serving. Most people   should limit total sodium intake to less than 2,300 mg per day.  Always check the nutrition information of foods labeled as "low-fat" or "nonfat". These foods may be higher in added sugar or refined carbohydrates and should be avoided.  Talk to your dietitian to identify your daily goals for nutrients listed on the label. Shopping  Avoid buying canned, premade, or processed foods. These  foods tend to be high in fat, sodium, and added sugar.  Shop around the outside edge of the grocery store. This includes fresh fruits and vegetables, bulk grains, fresh meats, and fresh dairy. Cooking  Use low-heat cooking methods, such as baking, instead of high-heat cooking methods like deep frying.  Cook using healthy oils, such as olive, canola, or sunflower oil.  Avoid cooking with butter, cream, or high-fat meats. Meal planning  Eat meals and snacks regularly, preferably at the same times every day. Avoid going long periods of time without eating.  Eat foods high in fiber, such as fresh fruits, vegetables, beans, and whole grains. Talk to your dietitian about how many servings of carbohydrates you can eat at each meal.  Eat 4-6 ounces of lean protein each day, such as lean meat, chicken, fish, eggs, or tofu. 1 ounce is equal to 1 ounce of meat, chicken, or fish, 1 egg, or 1/4 cup of tofu.  Eat some foods each day that contain healthy fats, such as avocado, nuts, seeds, and fish. Lifestyle   Check your blood glucose regularly.  Exercise at least 30 minutes 5 or more days each week, or as told by your health care provider.  Take medicines as told by your health care provider.  Do not use any products that contain nicotine or tobacco, such as cigarettes and e-cigarettes. If you need help quitting, ask your health care provider.  Work with a Social worker or diabetes educator to identify strategies to manage stress and any emotional and social challenges. What are some questions to ask my health care provider?  Do I need to meet with a diabetes educator?  Do I need to meet with a dietitian?  What number can I call if I have questions?  When are the best times to check my blood glucose? Where to find more information:  American Diabetes Association: diabetes.org/food-and-fitness/food  Academy of Nutrition and Dietetics:  PokerClues.dk  [employer] of Diabetes and Digestive and Kidney Diseases (NIH): ContactWire.be Summary  A healthy meal plan will help you control your blood glucose and maintain a healthy lifestyle.  Working with a diet and nutrition specialist (dietitian) can help you make a meal plan that is best for you.  Keep in mind that carbohydrates and alcohol have immediate effects on your blood glucose levels. It is important to count carbohydrates and to use alcohol carefully. This information is not intended to replace advice given to you by your health care provider. Make sure you discuss any questions you have with your health care provider. Document Released: 05/08/2005 Document Revised: 09/15/2016 Document Reviewed: 09/15/2016 Elsevier Interactive Patient Education  2018 [employer]. Diabetes Mellitus and Exercise Exercising regularly is important for your overall health, especially when you have diabetes (diabetes mellitus). Exercising is not only about losing weight. It has many health benefits, such as increasing muscle strength and bone density and reducing body fat and stress. This leads to improved fitness, flexibility, and endurance, all of which result in better overall health. Exercise has additional benefits for people with diabetes, including:  Reducing appetite.  Helping to lower  and control blood glucose.  Lowering blood pressure.  Helping to control amounts of fatty substances (lipids) in the blood, such as cholesterol and triglycerides.  Helping the body to respond better to insulin (improving insulin sensitivity).  Reducing how much insulin the body needs.  Decreasing the risk for heart disease by: ? Lowering cholesterol and triglyceride levels. ? Increasing the levels of good cholesterol. ? Lowering blood glucose levels.  What is my  activity plan? Your health care provider or certified diabetes educator can help you make a plan for the type and frequency of exercise (activity plan) that works for you. Make sure that you:  Do at least 150 minutes of moderate-intensity or vigorous-intensity exercise each week. This could be brisk walking, biking, or water aerobics. ? Do stretching and strength exercises, such as yoga or weightlifting, at least 2 times a week. ? Spread out your activity over at least 3 days of the week.  Get some form of physical activity every day. ? Do not go more than 2 days in a row without some kind of physical activity. ? Avoid being inactive for more than 90 minutes at a time. Take frequent breaks to walk or stretch.  Choose a type of exercise or activity that you enjoy, and set realistic goals.  Start slowly, and gradually increase the intensity of your exercise over time.  What do I need to know about managing my diabetes?  Check your blood glucose before and after exercising. ? If your blood glucose is higher than 240 mg/dL (13.3 mmol/L) before you exercise, check your urine for ketones. If you have ketones in your urine, do not exercise until your blood glucose returns to normal.  Know the symptoms of low blood glucose (hypoglycemia) and how to treat it. Your risk for hypoglycemia increases during and after exercise. Common symptoms of hypoglycemia can include: ? Hunger. ? Anxiety. ? Sweating and feeling clammy. ? Confusion. ? Dizziness or feeling light-headed. ? Increased heart rate or palpitations. ? Blurry vision. ? Tingling or numbness around the mouth, lips, or tongue. ? Tremors or shakes. ? Irritability.  Keep a rapid-acting carbohydrate snack available before, during, and after exercise to help prevent or treat hypoglycemia.  Avoid injecting insulin into areas of the body that are going to be exercised. For example, avoid injecting insulin into: ? The arms, when playing  tennis. ? The legs, when jogging.  Keep records of your exercise habits. Doing this can help you and your health care provider adjust your diabetes management plan as needed. Write down: ? Food that you eat before and after you exercise. ? Blood glucose levels before and after you exercise. ? The type and amount of exercise you have done. ? When your insulin is expected to peak, if you use insulin. Avoid exercising at times when your insulin is peaking.  When you start a new exercise or activity, work with your health care provider to make sure the activity is safe for you, and to adjust your insulin, medicines, or food intake as needed.  Drink plenty of water while you exercise to prevent dehydration or heat stroke. Drink enough fluid to keep your urine clear or pale yellow. This information is not intended to replace advice given to you by your health care provider. Make sure you discuss any questions you have with your health care provider. Document Released: 11/01/2003 Document Revised: 02/29/2016 Document Reviewed: 01/21/2016 Elsevier Interactive Patient Education  2018 Sara Wood, Female Preventive  care refers to lifestyle choices and visits with your health care provider that can promote health and wellness. What does preventive care include?  A yearly physical exam. This is also called an annual well check.  Dental exams once or twice a year.  Routine eye exams. Ask your health care provider how often you should have your eyes checked.  Personal lifestyle choices, including: ? Daily care of your teeth and gums. ? Regular physical activity. ? Eating a healthy diet. ? Avoiding tobacco and drug use. ? Limiting alcohol use. ? Practicing safe sex. ? Taking low-dose aspirin daily starting at age 25. ? Taking vitamin and mineral supplements as recommended by your health care provider. What happens during an annual well check? The services and  screenings done by your health care provider during your annual well check will depend on your age, overall health, lifestyle risk factors, and family history of disease. Counseling Your health care provider may ask you questions about your:  Alcohol use.  Tobacco use.  Drug use.  Emotional well-being.  Home and relationship well-being.  Sexual activity.  Eating habits.  Work and work Statistician.  Method of birth control.  Menstrual cycle.  Pregnancy history.  Screening You may have the following tests or measurements:  Height, weight, and BMI.  Blood pressure.  Lipid and cholesterol levels. These may be checked every 5 Wood, or more frequently if you are over 2 Wood old.  Skin check.  Lung cancer screening. You may have this screening every year starting at age 59 if you have a 30-pack-year history of smoking and currently smoke or have quit within the past 15 Wood.  Fecal occult blood test (FOBT) of the stool. You may have this test every year starting at age 70.  Flexible sigmoidoscopy or colonoscopy. You may have a sigmoidoscopy every 5 Wood or a colonoscopy every 10 Wood starting at age 59.  Hepatitis C blood test.  Hepatitis B blood test.  Sexually transmitted disease (STD) testing.  Diabetes screening. This is done by checking your blood sugar (glucose) after you have not eaten for a while (fasting). You may have this done every 1-3 Wood.  Mammogram. This may be done every 1-2 Wood. Talk to your health care provider about when you should start having regular mammograms. This may depend on whether you have a family history of breast cancer.  BRCA-related cancer screening. This may be done if you have a family history of breast, ovarian, tubal, or peritoneal cancers.  Pelvic exam and Pap test. This may be done every 3 Wood starting at age 14. Starting at age 32, this may be done every 5 Wood if you have a Pap test in combination with an HPV  test.  Bone density scan. This is done to screen for osteoporosis. You may have this scan if you are at high risk for osteoporosis.  Discuss your test results, treatment options, and if necessary, the need for more tests with your health care provider. Vaccines Your health care provider may recommend certain vaccines, such as:  Influenza vaccine. This is recommended every year.  Tetanus, diphtheria, and acellular pertussis (Tdap, Td) vaccine. You may need a Td booster every 10 Wood.  Varicella vaccine. You may need this if you have not been vaccinated.  Zoster vaccine. You may need this after age 7.  Measles, mumps, and rubella (MMR) vaccine. You may need at least one dose of MMR if you were born in 1957 or later. You may  also need a second dose.  Pneumococcal 13-valent conjugate (PCV13) vaccine. You may need this if you have certain conditions and were not previously vaccinated.  Pneumococcal polysaccharide (PPSV23) vaccine. You may need one or two doses if you smoke cigarettes or if you have certain conditions.  Meningococcal vaccine. You may need this if you have certain conditions.  Hepatitis A vaccine. You may need this if you have certain conditions or if you travel or work in places where you may be exposed to hepatitis A.  Hepatitis B vaccine. You may need this if you have certain conditions or if you travel or work in places where you may be exposed to hepatitis B.  Haemophilus influenzae type b (Hib) vaccine. You may need this if you have certain conditions.  Talk to your health care provider about which screenings and vaccines you need and how often you need them. This information is not intended to replace advice given to you by your health care provider. Make sure you discuss any questions you have with your health care provider. Document Released: 09/07/2015 Document Revised: 04/30/2016 Document Reviewed: 06/12/2015 Elsevier Interactive Patient Education  [employer].

## 2018-07-21 NOTE — Telephone Encounter (Signed)
Patient is calling about her prescription for glipiZIDE (GLUCOTROL) 10 MG tablet. Hasn't been sent to her Pharmacy. Patient was seen today for annual. Please advise

## 2018-07-21 NOTE — Progress Notes (Signed)
Patient ID: Sara Wood, female   DOB: 01-04-1966, 52 y.o.   MRN: [id number]      Gynecology Annual Exam  PCP: Abran Richard, MD  Chief Complaint:  Chief Complaint  Patient presents with  . Gynecologic Exam    History of Present Illness:Patient is a 52 y.o. [id number] presents for annual exam. The patient has no complaints today. She has improved her diet by not drinking sugar sweetened beverages and decreasing carbohydrates. She has increased her activity level to walking 1 mile 4 days per week. She has lost 8 pounds since last year's visit and reports feeling better in general and her vision has improved. She would like to repeat her HgbA1C today along with other routine lab work. She is in her last year of treatment with Tamoxifen.   LMP: Patient's last menstrual period was 11/26/2012.  Postcoital Bleeding: no Dysmenorrhea: not applicable  The patient is sexually active. She denies dyspareunia.  The patient does perform self breast exams.  There is no notable family history of breast or ovarian cancer in her family.  The patient wears seatbelts: yes.   The patient has regular exercise: yes.    The patient denies current symptoms of depression.     Review of Systems: Review of Systems  Constitutional: Negative.   HENT: Negative.   Eyes: Negative.   Respiratory: Negative.   Cardiovascular: Negative.   Gastrointestinal: Negative.   Genitourinary: Negative.   Musculoskeletal: Negative.   Skin: Negative.   Neurological: Negative.   Endo/Heme/Allergies: Negative.   Psychiatric/Behavioral: Negative.     Past Medical History:  Past Medical History:  Diagnosis Date  . Abnormal mammogram, unspecified 2013  . Breast cancer ([place]) 07/2012   left breast  . Breast screening, unspecified 2013  . Diabetes mellitus without complication ([place]) 0240  . 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  . Mammographic microcalcification 2013  . Obesity, unspecified 2013  . Personal history of radiation therapy   . Radiation 10/05/12   treatment start date for 24 treatments    Past Surgical History:  Past Surgical History:  Procedure Laterality Date  . BREAST BIOPSY Left 08/02/2012   positive, stereo  . BREAST EXCISIONAL BIOPSY Left 08/24/2012   radiation  . BREAST SURGERY Left 08/24/2012   Wide excision left breast DCIS  . BREAST SURGERY  09/03/2012   Re excision of both medial and lateral margins on left breast  . CESAREAN SECTION  1991,1995,2000  . TUBAL LIGATION      Gynecologic History:  Patient's last menstrual period was 11/26/2012. Last Pap: 1 year ago Results were:  no abnormalities  Last mammogram: 11 months ago Results were: BI-RAD II  Obstetric History: [id number]  Family History:  Family History  Problem Relation Age of Onset  . Colon polyps Father   . Breast cancer Neg Hx     Social History:  Social History   Socioeconomic History  . Marital status: Married    Spouse name: Not on file  . Number of children: Not on file  . Years of education: Not on file  . Highest education level: Not on file  Occupational History  . Not on file  Social Needs  . Financial resource strain: Not on file  . Food insecurity:    Worry: Not on file    Inability: Not on file  . Transportation needs:    Medical: Not on file  Non-medical: Not on file  Tobacco Use  . Smoking status: Never Smoker  . Smokeless tobacco: Never Used  Substance and Sexual Activity  . Alcohol use: No  . Drug use: No  . Sexual activity: Yes    Birth control/protection: Post-menopausal  Lifestyle  . Physical activity:    Days per week: Not on file    Minutes per session: Not on file  . Stress: Not on file  Relationships  . Social connections:    Talks on phone: Not on file    Gets together: Not on file    Attends religious service: Not on file    Active member of club or  organization: Not on file    Attends meetings of clubs or organizations: Not on file    Relationship status: Not on file  . Intimate partner violence:    Fear of current or ex partner: Not on file    Emotionally abused: Not on file    Physically abused: Not on file    Forced sexual activity: Not on file  Other Topics Concern  . Not on file  Social History Narrative  . Not on file    Allergies:  No Known Allergies  Medications: Prior to Admission medications   Medication Sig Start Date End Date Taking? Authorizing Provider  aspirin 81 MG tablet Take 81 mg by mouth daily.   Yes [provider]  glipiZIDE (GLUCOTROL) 10 MG tablet Take 1 tablet (10 mg total) by mouth 2 (two) times daily before a meal. 04/12/18  Yes Rod Can, CNM  tamoxifen (NOLVADEX) 20 MG tablet Take 1 tablet (20 mg total) by mouth daily. 03/16/17  Yes Byrnett, Forest Gleason, MD    Physical Exam Vitals: Blood pressure 126/82, pulse 73, height 5\' 4"  (1.626 m), weight 202 lb (91.6 kg), last menstrual period 11/26/2012.  General: NAD HEENT: normocephalic, anicteric Thyroid: no enlargement, no palpable nodules Pulmonary: No increased work of breathing, CTAB Cardiovascular: RRR, distal pulses 2+ Breast: Breast symmetrical, no tenderness, no palpable nodules or masses, no skin or nipple retraction present, no nipple discharge.  No axillary or supraclavicular lymphadenopathy. Abdomen: NABS, soft, non-tender, non-distended.  Umbilicus without lesions.  No hepatomegaly, splenomegaly or masses palpable. No evidence of hernia  Genitourinary: deferred for no concerns/PAP interval Extremities: no edema, erythema, or tenderness Neurologic: Grossly intact Psychiatric: mood appropriate, affect full     Assessment: 52 y.o. [id number] routine annual exam  Plan: Problem List Items Addressed This Visit    None    Visit Diagnoses    Well woman exam without gynecological exam    -  Primary   Blood tests for routine  general physical examination       Relevant Orders   Hgb A1c w/o eAG   TSH   VITAMIN D 25 Hydroxy (Vit-D Deficiency, Fractures)   Lipid Panel With LDL/HDL Ratio   CBC with Differential/Platelet      1) Mammogram - recommend yearly screening mammogram.  Mammogram is scheduled for January 2020  2) STI screening  wasoffered and declined  3) ASCCP guidelines and rational discussed.  Patient opts for every 3 years screening interval  4) Osteoporosis  - per USPTF routine screening DEXA at age 35  Consider FDA-approved medical therapies in postmenopausal women and men aged 59 years and older, based on the following: a) A hip or vertebral (clinical or morphometric) fracture b) T-score ? -2.5 at the femoral neck or spine after appropriate evaluation to exclude secondary causes C) Low  bone mass (T-score between -1.0 and -2.5 at the femoral neck or spine) and a 10-year probability of a hip fracture ? 3% or a 10-year probability of a major osteoporosis-related fracture ? 20% based on the [place]-adapted WHO algorithm   5) Routine healthcare maintenance including cholesterol, diabetes screening discussed Ordered today  6) Colonoscopy: She plans to have screening summer 2020.  Screening recommended starting at age 32 for average risk individuals, age 20 for individuals deemed at increased risk (including African Americans) and recommended to continue until age 23.  For patient age 93-85 individualized approach is recommended.  Gold standard screening is via colonoscopy, Cologuard screening is an acceptable alternative for patient unwilling or unable to undergo colonoscopy.  "Colorectal cancer screening for average?risk adults: 2018 guideline update from the [place]: A Cancer Journal for Clinicians: Jan 21, 2017   7) Return in 1 year (on 07/22/2019) for annual established gyn.    Rod Can, CNM [facility] 07/21/2018, 12:57 PM

## 2018-07-21 NOTE — Telephone Encounter (Signed)
Patient aware.

## 2018-07-21 NOTE — Telephone Encounter (Signed)
Rx was sent. Please let her know.

## 2018-07-22 LAB — CBC WITH DIFFERENTIAL/PLATELET
Basophils Absolute: 0 10*3/uL (ref 0.0–0.2)
Basos: 0 %
EOS (ABSOLUTE): 0.2 10*3/uL (ref 0.0–0.4)
EOS: 3 %
Hematocrit: 37.6 % (ref 34.0–46.6)
Hemoglobin: 12.4 g/dL (ref 11.1–15.9)
IMMATURE GRANULOCYTES: 0 %
Immature Grans (Abs): 0 10*3/uL (ref 0.0–0.1)
LYMPHS ABS: 2.4 10*3/uL (ref 0.7–3.1)
Lymphs: 29 %
MCH: 27.7 pg (ref 26.6–33.0)
MCHC: 33 g/dL (ref 31.5–35.7)
MCV: 84 fL (ref 79–97)
MONOCYTES: 7 %
MONOS ABS: 0.5 10*3/uL (ref 0.1–0.9)
NEUTROS ABS: 5 10*3/uL (ref 1.4–7.0)
Neutrophils: 61 %
PLATELETS: 411 10*3/uL (ref 150–450)
RBC: 4.47 x10E6/uL (ref 3.77–5.28)
RDW: 12.8 % (ref 12.3–15.4)
WBC: 8.1 10*3/uL (ref 3.4–10.8)

## 2018-07-22 LAB — LIPID PANEL WITH LDL/HDL RATIO
CHOLESTEROL TOTAL: 192 mg/dL (ref 100–199)
HDL: 29 mg/dL — AB (ref >39)
LDL Calculated: 102 mg/dL — ABNORMAL HIGH (ref 0–99)
LDl/HDL Ratio: 3.5 ratio — ABNORMAL HIGH (ref 0.0–3.2)
TRIGLYCERIDES: 303 mg/dL — AB (ref 0–149)
VLDL CHOLESTEROL CAL: 61 mg/dL — AB (ref 5–40)

## 2018-07-22 LAB — HGB A1C W/O EAG: Hgb A1c MFr Bld: 6.7 % — ABNORMAL HIGH (ref 4.8–5.6)

## 2018-07-22 LAB — TSH: TSH: 3.88 u[IU]/mL (ref 0.450–4.500)

## 2018-07-22 LAB — VITAMIN D 25 HYDROXY (VIT D DEFICIENCY, FRACTURES): VIT D 25 HYDROXY: 11.6 ng/mL — AB (ref 30.0–100.0)

## 2018-07-26 ENCOUNTER — Telehealth: Payer: Self-pay

## 2018-07-26 NOTE — Telephone Encounter (Signed)
Pt inquiring about lab results from last weeks visit. QH#[phone]. Ok to leave message.

## 2018-07-27 NOTE — Telephone Encounter (Signed)
Pt transferred from front desk. Pt inquiring what her A1C is. Labs not yet reviewed by JEG. ABC reviewed labs & permitted to notify patient of her current A1C level improved at 6.7 from 9 that it was in 03/2018. Pt appreciate of results & will await JEG to contact her regarding the remainder of her results.

## 2018-08-20 ENCOUNTER — Ambulatory Visit: Payer: BC Managed Care – PPO

## 2018-09-15 ENCOUNTER — Ambulatory Visit
Admission: RE | Admit: 2018-09-15 | Discharge: 2018-09-15 | Disposition: A | Discharge status: Home / Self Care | Payer: BC Managed Care – PPO | Source: Ambulatory Visit | Attending: Advanced Practice Midwife | Admitting: Advanced Practice Midwife

## 2018-09-15 DIAGNOSIS — Z1231 Encounter for screening mammogram for malignant neoplasm of breast: Secondary | ICD-10-CM

## 2018-09-28 ENCOUNTER — Ambulatory Visit: Payer: BC Managed Care – PPO | Admitting: General Surgery

## 2019-01-07 ENCOUNTER — Telehealth: Payer: Self-pay

## 2019-01-07 NOTE — Telephone Encounter (Signed)
Pt calling for referral to [facility] for colonoscopy.  Okay to leave vm.  [phone]

## 2019-01-12 ENCOUNTER — Other Ambulatory Visit: Payer: Self-pay | Admitting: Advanced Practice Midwife

## 2019-01-12 DIAGNOSIS — Z1211 Encounter for screening for malignant neoplasm of colon: Secondary | ICD-10-CM

## 2019-01-12 NOTE — Progress Notes (Signed)
Referral sent for screening colonoscopy

## 2019-05-31 ENCOUNTER — Other Ambulatory Visit: Payer: Self-pay | Admitting: Advanced Practice Midwife

## 2019-05-31 DIAGNOSIS — E1139 Type 2 diabetes mellitus with other diabetic ophthalmic complication: Secondary | ICD-10-CM

## 2019-05-31 MED ORDER — GLIPIZIDE 10 MG PO TABS: 10.0000 mg | ORAL_TABLET | Freq: Two times a day (BID) | ORAL | 2 refills | Status: DC

## 2019-05-31 NOTE — Progress Notes (Unsigned)
Rx Glipizide sent to patient enough to last through January.

## 2019-06-03 ENCOUNTER — Other Ambulatory Visit: Payer: Self-pay | Admitting: Advanced Practice Midwife

## 2019-07-12 NOTE — Telephone Encounter (Signed)
Done

## 2019-08-24 ENCOUNTER — Ambulatory Visit (INDEPENDENT_AMBULATORY_CARE_PROVIDER_SITE_OTHER): Payer: BC Managed Care – PPO | Admitting: Advanced Practice Midwife

## 2019-08-24 ENCOUNTER — Encounter: Payer: Self-pay | Admitting: Advanced Practice Midwife

## 2019-08-24 ENCOUNTER — Other Ambulatory Visit: Payer: Self-pay | Admitting: Advanced Practice Midwife

## 2019-08-24 ENCOUNTER — Other Ambulatory Visit: Payer: Self-pay

## 2019-08-24 VITALS — BP 120/80 | Ht 64.0 in | Wt 213.0 lb

## 2019-08-24 DIAGNOSIS — E1139 Type 2 diabetes mellitus with other diabetic ophthalmic complication: Secondary | ICD-10-CM

## 2019-08-24 DIAGNOSIS — Z6836 Body mass index (BMI) 36.0-36.9, adult: Secondary | ICD-10-CM

## 2019-08-24 DIAGNOSIS — Z0000 Encounter for general adult medical examination without abnormal findings: Secondary | ICD-10-CM

## 2019-08-24 DIAGNOSIS — Z1231 Encounter for screening mammogram for malignant neoplasm of breast: Secondary | ICD-10-CM

## 2019-08-24 DIAGNOSIS — E669 Obesity, unspecified: Secondary | ICD-10-CM | POA: Diagnosis not present

## 2019-08-24 NOTE — Progress Notes (Addendum)
Gynecology Annual Exam  PCP: Abran Richard, MD  Chief Complaint:  Chief Complaint  Patient presents with  . Gynecologic Exam    History of Present Illness:Patient is a 53 y.o. [id number] presents for annual exam. The patient has no gyn complaints today. She requests lab work today including thyroid since she has gained weight in the past year.  LMP: Patient's last menstrual period was 11/26/2012. Menarche:not applicable  Postcoital Bleeding: no Dysmenorrhea: not applicable  The patient is sexually active. She denies dyspareunia.  The patient does perform self breast exams.  There is no notable family history of breast or ovarian cancer in her family.  The patient wears seatbelts: yes.   The patient has regular exercise: she has not been getting as much exercise recently. She is sedentary at her job and is not walking outside in the cold weather. She has not done as well with healthy diet over the holidays. She drinks adequate water and tries to limit sodas. She admits adequate sleep.    The patient denies current symptoms of depression.     Review of Systems: Review of Systems  Constitutional: Negative.   HENT: Negative.   Eyes: Negative.   Respiratory: Negative.   Cardiovascular: Negative.   Gastrointestinal: Negative.   Genitourinary: Negative.   Musculoskeletal: Negative.   Skin: Negative.   Neurological: Negative.   Endo/Heme/Allergies: Negative.   Psychiatric/Behavioral: Negative.     Past Medical History:  Past Medical History:  Diagnosis Date  . Abnormal mammogram, unspecified 2013  . Breast cancer ([place]) 07/2012   left breast  . Breast screening, unspecified 2013  . Diabetes mellitus without complication ([place]) 0000000  . 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  . Mammographic microcalcification 2013  . Obesity, unspecified 2013  . Personal history of radiation  therapy   . Radiation 10/05/12   treatment start date for 24 treatments    Past Surgical History:  Past Surgical History:  Procedure Laterality Date  . BREAST BIOPSY Left 08/02/2012   positive, stereo  . BREAST EXCISIONAL BIOPSY Left 08/24/2012   radiation  . BREAST LUMPECTOMY Left 2013  . BREAST SURGERY Left 08/24/2012   Wide excision left breast DCIS  . BREAST SURGERY  09/03/2012   Re excision of both medial and lateral margins on left breast  . CESAREAN SECTION  1991,1995,2000  . TUBAL LIGATION      Gynecologic History:  Patient's last menstrual period was 11/26/2012. Last Pap: 2 years ago Results were:  no abnormalities  Last mammogram: 1 year ago Results were: BI-RAD I  Obstetric [id number]  Family History:  Family History  Problem Relation Age of Onset  . Colon polyps Father   . Breast cancer Neg Hx     Social History:  Social History   Socioeconomic History  . Marital status: Married    Spouse name: Not on file  . Number of children: Not on file  . Years of education: Not on file  . Highest education level: Not on file  Occupational History  . Not on file  Tobacco Use  . Smoking status: Never Smoker  . Smokeless tobacco: Never Used  Substance and Sexual Activity  . Alcohol use: No  . Drug use: No  . Sexual activity: Yes    Birth control/protection: Post-menopausal  Other Topics Concern  . Not on file  Social History Narrative  . Not on  file   Social Determinants of Health   Financial Resource Strain:   . Difficulty of Paying Living Expenses:   Food Insecurity:   . Worried About Charity fundraiser in the Last Year:   . Arboriculturist in the Last Year:   Transportation Needs:   . Film/video editor (Medical):   Marland Kitchen Lack of Transportation (Non-Medical):   Physical Activity:   . Days of Exercise per Week:   . Minutes of Exercise per Session:   Stress:   . Feeling of Stress :   Social Connections:   . Frequency of Communication with  Friends and Family:   . Frequency of Social Gatherings with Friends and Family:   . Attends Religious Services:   . Active Member of Clubs or Organizations:   . Attends Archivist Meetings:   Marland Kitchen Marital Status:   Intimate Partner Violence:   . Fear of Current or Ex-Partner:   . Emotionally Abused:   Marland Kitchen Physically Abused:   . Sexually Abused:     Allergies:  No Known Allergies  Medications: Prior to Admission medications   Medication Sig Start Date End Date Taking? Authorizing Provider  aspirin 81 MG tablet Take 81 mg by mouth daily.   Yes [provider]  glipiZIDE (GLUCOTROL) 10 MG tablet Take 1 tablet (10 mg total) by mouth 2 (two) times daily before a meal. 05/31/19  Yes Rod Can, CNM  tamoxifen (NOLVADEX) 20 MG tablet Take 1 tablet (20 mg total) by mouth daily. 03/16/17  Yes Byrnett, Forest Gleason, MD    Physical Exam Vitals: Blood pressure 120/80, height 5\' 4"  (1.626 m), weight 213 lb (96.6 kg), last menstrual period 11/26/2012.  General: NAD HEENT: normocephalic, anicteric Thyroid: no enlargement, no palpable nodules Pulmonary: No increased work of breathing, CTAB Cardiovascular: RRR, distal pulses 2+ Breast: Breast symmetrical, no tenderness, no palpable nodules or masses, no skin or nipple retraction present, no nipple discharge.  No axillary or supraclavicular lymphadenopathy. Abdomen: NABS, soft, non-tender, non-distended.  Umbilicus without lesions.  No hepatomegaly, splenomegaly or masses palpable. No evidence of hernia  Genitourinary:  External: Normal external female genitalia.  Normal urethral meatus, normal Bartholin's and Skene's glands.    Vagina: Normal vaginal mucosa, no evidence of prolapse.    Cervix: Grossly normal in appearance, no bleeding, no CMT  Uterus: Non-enlarged, mobile, normal contour.    Adnexa: ovaries non-enlarged, no adnexal masses  Rectal: deferred  Lymphatic: no evidence of inguinal lymphadenopathy Extremities: no  edema, erythema, or tenderness Neurologic: Grossly intact Psychiatric: mood appropriate, affect full     Assessment: 53 y.o. [id number] routine annual exam  Plan: Problem List Items Addressed This Visit      Other   Obesity, unspecified   Relevant Medications   glipiZIDE (GLUCOTROL) 10 MG tablet    Other Visit Diagnoses    Well woman exam without gynecological exam    -  Primary   Relevant Orders   TSH + free T4 (LC) (Completed)   Hgb A1c (LC) (Completed)   Lipid Panel With LDL/HDL Ratio (LC) (Completed)   CBC w/o diff (LC) (Completed)   Mammogram Screening Routine   Ambulatory referral to Gastroenterology   Blood tests for routine general physical examination       Relevant Orders   TSH + free T4 (LC) (Completed)   Hgb A1c (LC) (Completed)   Lipid Panel With LDL/HDL Ratio (LC) (Completed)   CBC w/o diff (LC) (Completed)   Type 2 diabetes  mellitus with other ophthalmic complication, without long-term current use of insulin (HCC)       Relevant Medications   glipiZIDE (GLUCOTROL) 10 MG tablet      1) Mammogram - recommend yearly screening mammogram.  Mammogram Was ordered today  2) STI screening  was offered and declined  3) ASCCP guidelines and rationale discussed.  Patient opts for every 5 years screening interval  4) Osteoporosis  - per USPTF routine screening DEXA at age 35   Consider FDA-approved medical therapies in postmenopausal women and men aged 39 years and older, based on the following: a) A hip or vertebral (clinical or morphometric) fracture b) T-score ? -2.5 at the femoral neck or spine after appropriate evaluation to exclude secondary causes C) Low bone mass (T-score between -1.0 and -2.5 at the femoral neck or spine) and a 10-year probability of a hip fracture ? 3% or a 10-year probability of a major osteoporosis-related fracture ? 20% based on the [place]-adapted WHO algorithm   5) Routine healthcare maintenance including cholesterol, diabetes screening  discussed Ordered today  6) Colonoscopy patient was unable to schedule colonoscopy this past summer due to Covid. She requests referral so she can schedule this upcoming summer.  Screening recommended starting at age 43 for average risk individuals, age 4 for individuals deemed at increased risk (including African Americans) and recommended to continue until age 29.  For patient age 58-85 individualized approach is recommended.  Gold standard screening is via colonoscopy, Cologuard screening is an acceptable alternative for patient unwilling or unable to undergo colonoscopy.  "Colorectal cancer screening for average?risk adults: 2018 guideline update from the [place]: A Cancer Journal for Clinicians: Jan 21, 2017   7) Return in about 1 year (around 08/23/2020) for annual established gyn.    Rod Can, [facility]   08/24/2019, 12:59 PM

## 2019-08-25 LAB — LIPID PANEL WITH LDL/HDL RATIO
Cholesterol, Total: 229 mg/dL — ABNORMAL HIGH (ref 100–199)
HDL: 28 mg/dL — ABNORMAL LOW (ref >39)
LDL Chol Calc (NIH): 126 mg/dL — ABNORMAL HIGH (ref 0–99)
LDL/HDL Ratio: 4.5 ratio — ABNORMAL HIGH (ref 0.0–3.2)
Triglycerides: 421 mg/dL — ABNORMAL HIGH (ref 0–149)
VLDL Cholesterol Cal: 75 mg/dL — ABNORMAL HIGH (ref 5–40)

## 2019-08-25 LAB — CBC
Hematocrit: 37 % (ref 34.0–46.6)
Hemoglobin: 12.4 g/dL (ref 11.1–15.9)
MCH: 28.9 pg (ref 26.6–33.0)
MCHC: 33.5 g/dL (ref 31.5–35.7)
MCV: 86 fL (ref 79–97)
Platelets: 354 10*3/uL (ref 150–450)
RBC: 4.29 x10E6/uL (ref 3.77–5.28)
RDW: 13.2 % (ref 11.7–15.4)
WBC: 7.9 10*3/uL (ref 3.4–10.8)

## 2019-08-25 LAB — TSH+FREE T4
Free T4: 0.92 ng/dL (ref 0.82–1.77)
TSH: 3.37 u[IU]/mL (ref 0.450–4.500)

## 2019-08-25 LAB — HGB A1C W/O EAG: Hgb A1c MFr Bld: 7.1 % — ABNORMAL HIGH (ref 4.8–5.6)

## 2019-08-25 MED ORDER — GLIPIZIDE 10 MG PO TABS: 10.0000 mg | ORAL_TABLET | Freq: Two times a day (BID) | ORAL | 11 refills | Status: DC

## 2019-08-25 NOTE — Addendum Note (Signed)
Addended by: Rod Can on: 08/25/2019 05:01 PM   Modules accepted: Orders

## 2019-09-07 ENCOUNTER — Other Ambulatory Visit: Payer: Self-pay | Admitting: Advanced Practice Midwife

## 2019-09-07 DIAGNOSIS — Z1211 Encounter for screening for malignant neoplasm of colon: Secondary | ICD-10-CM

## 2019-09-07 NOTE — Progress Notes (Unsigned)
Referral for colonoscopy resent.

## 2019-09-26 ENCOUNTER — Ambulatory Visit
Admission: RE | Admit: 2019-09-26 | Discharge: 2019-09-26 | Disposition: A | Discharge status: Home / Self Care | Payer: BC Managed Care – PPO | Source: Ambulatory Visit | Attending: Advanced Practice Midwife | Admitting: Advanced Practice Midwife

## 2019-09-26 DIAGNOSIS — Z1231 Encounter for screening mammogram for malignant neoplasm of breast: Secondary | ICD-10-CM | POA: Diagnosis not present

## 2020-05-08 ENCOUNTER — Ambulatory Visit
Admission: EM | Admit: 2020-05-08 | Discharge: 2020-05-08 | Disposition: A | Discharge status: Home / Self Care | Payer: BC Managed Care – PPO | Attending: Family Medicine | Admitting: Family Medicine

## 2020-05-08 ENCOUNTER — Encounter: Payer: Self-pay | Admitting: Emergency Medicine

## 2020-05-08 ENCOUNTER — Other Ambulatory Visit: Payer: Self-pay

## 2020-05-08 DIAGNOSIS — B349 Viral infection, unspecified: Secondary | ICD-10-CM | POA: Diagnosis present

## 2020-05-08 DIAGNOSIS — U071 COVID-19: Secondary | ICD-10-CM | POA: Diagnosis not present

## 2020-05-08 DIAGNOSIS — Z20822 Contact with and (suspected) exposure to covid-19: Secondary | ICD-10-CM | POA: Insufficient documentation

## 2020-05-08 MED ORDER — ONDANSETRON 4 MG PO TBDP: 4.0000 mg | ORAL_TABLET | Freq: Three times a day (TID) | ORAL | 0 refills | Status: DC | PRN

## 2020-05-08 MED ORDER — KETOROLAC TROMETHAMINE 10 MG PO TABS: 10.0000 mg | ORAL_TABLET | Freq: Four times a day (QID) | ORAL | 0 refills | Status: DC | PRN

## 2020-05-08 NOTE — ED Triage Notes (Signed)
Pt c/o cough, body aches, nausea and weakness. Started yesterday. She states her husband tested positive about 3 days ago.

## 2020-05-08 NOTE — Discharge Instructions (Signed)
Stay home.  Stay hydrated.  Medication as prescribed.  Take care  Dr. Lacinda Axon

## 2020-05-08 NOTE — ED Provider Notes (Signed)
[facility]    CSN: [id number] Arrival date & time: 05/08/20  1152  History   Chief Complaint Chief Complaint  Patient presents with  . Cough   HPI  54 year old female presents with cough.  3 day history of symptoms. Reports cough, nausea, body aches, weakness. No documented fever. Husband is positive for cough. She is most bothered by nausea and body aches. No relieving factors. No other associated symptoms. No other complaints.    Past Medical History:  Diagnosis Date  . Abnormal mammogram, unspecified 2013  . Breast cancer ([place]) 07/2012   left breast  . Breast screening, unspecified 2013  . Diabetes mellitus without complication ([place]) 7096  . 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  . Mammographic microcalcification 2013  . Obesity, unspecified 2013  . Personal history of radiation therapy   . Radiation 10/05/12   treatment start date for 24 treatments    Patient Active Problem List   Diagnosis Date Noted  . 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 Obesity, unspecified 2013    Past Surgical History:  Procedure Laterality Date  . BREAST BIOPSY Left 08/02/2012   positive, stereo  . BREAST EXCISIONAL BIOPSY Left 08/24/2012   radiation  . BREAST LUMPECTOMY Left 2013  . BREAST SURGERY Left 08/24/2012   Wide excision left breast DCIS  . BREAST SURGERY  09/03/2012   Re excision of both medial and lateral margins on left breast  . CESAREAN SECTION  1991,1995,2000  . TUBAL LIGATION      OB History    Gravida  4   Para  3   Term  3   Preterm      AB  1   Living  3     SAB  1   TAB      Ectopic      Multiple      Live Births  3        Obstetric Comments  Age first menstruation-14 Age first pregnancy-22 LMP-07/15/12         Home Medications    Prior to Admission medications   Medication Sig Start Date End Date Taking?  Authorizing Provider  glipiZIDE (GLUCOTROL) 10 MG tablet Take 1 tablet (10 mg total) by mouth 2 (two) times daily before a meal. 08/25/19  Yes Rod Can, CNM  tamoxifen (NOLVADEX) 20 MG tablet Take 1 tablet (20 mg total) by mouth daily. 03/16/17  Yes Byrnett, Forest Gleason, MD  ketorolac (TORADOL) 10 MG tablet Take 1 tablet (10 mg total) by mouth every 6 (six) hours as needed for moderate pain or severe pain. 05/08/20   Coral Spikes, DO  ondansetron (ZOFRAN ODT) 4 MG disintegrating tablet Take 1 tablet (4 mg total) by mouth every 8 (eight) hours as needed for nausea or vomiting. 05/08/20   Coral Spikes, DO    Family History Family History  Problem Relation Age of Onset  . Colon polyps Father   . Breast cancer Neg Hx     Social History Social History   Tobacco Use  . Smoking status: Never Smoker  . Smokeless tobacco: Never Used  Vaping Use  . Vaping Use: Never used  Substance Use Topics  . Alcohol use: No  . Drug use: No     Allergies   Patient has no known allergies.   Review of Systems Review of Systems  Respiratory:  Positive for cough.   Gastrointestinal: Positive for nausea.  Musculoskeletal:       Body aches.  Neurological: Positive for weakness.   Physical Exam Triage Vital Signs ED Triage Vitals  Enc Vitals Group     BP 05/08/20 1323 127/85     Pulse Rate 05/08/20 1323 82     Resp 05/08/20 1323 18     Temp 05/08/20 1323 99.2 F (37.3 C)     Temp Source 05/08/20 1323 Oral     SpO2 05/08/20 1323 97 %     Weight 05/08/20 1320 212 lb 15.4 oz (96.6 kg)     Height 05/08/20 1320 5\' 4"  (1.626 m)     Head Circumference --      Peak Flow --      Pain Score 05/08/20 1320 8     Pain Loc --      Pain Edu? --      Excl. in [place]? --    Updated Vital Signs BP 127/85 (BP Location: Left Arm)   Pulse 82   Temp 99.2 F (37.3 C) (Oral)   Resp 18   Ht 5\' 4"  (1.626 m)   Wt 96.6 kg   LMP 11/26/2012   SpO2 97%   BMI 36.56 kg/m   Visual Acuity Right Eye Distance:     Left Eye Distance:   Bilateral Distance:    Right Eye Near:   Left Eye Near:    Bilateral Near:     Physical Exam Vitals and nursing note reviewed.  Constitutional:      General: She is not in acute distress.    Appearance: Normal appearance. She is not ill-appearing.  HENT:     Head: Normocephalic and atraumatic.  Eyes:     General:        Right eye: No discharge.        Left eye: No discharge.     Conjunctiva/sclera: Conjunctivae normal.  Cardiovascular:     Rate and Rhythm: Normal rate and regular rhythm.  Pulmonary:     Effort: Pulmonary effort is normal.     Breath sounds: Normal breath sounds. No wheezing or rales.  Neurological:     Mental Status: She is alert.  Psychiatric:        Mood and Affect: Mood normal.        Behavior: Behavior normal.     UC Treatments / Results  Labs (all labs ordered are listed, but only abnormal results are displayed) Labs Reviewed  SARS CORONAVIRUS 2 (TAT 6-24 HRS)    EKG   Radiology No results found.  Procedures Procedures (including critical care time)  Medications Ordered in UC Medications - No data to display  Initial Impression / Assessment and Plan / UC Course  I have reviewed the triage vital signs and the nursing notes.  Pertinent labs & imaging results that were available during my care of the patient were reviewed by me and considered in my medical decision making (see chart for details).    54 year old female presents with a viral illness. Suspected COVID-19. Awaiting test results. Treating symptomatically with Toradol and Zofran.   Final Clinical Impressions(s) / UC Diagnoses   Final diagnoses:  Viral illness  Suspected COVID-19 virus infection     Discharge Instructions     Stay home.  Stay hydrated.  Medication as prescribed.  Take care  Dr. Lacinda Axon    ED Prescriptions    Medication Sig Dispense Auth. Provider   ondansetron (  ZOFRAN ODT) 4 MG disintegrating tablet Take 1 tablet (4 mg  total) by mouth every 8 (eight) hours as needed for nausea or vomiting. 20 tablet Martisha Toulouse G, DO   ketorolac (TORADOL) 10 MG tablet Take 1 tablet (10 mg total) by mouth every 6 (six) hours as needed for moderate pain or severe pain. 20 tablet Coral Spikes, DO     PDMP not reviewed this encounter.   Coral Spikes, [place] 05/08/20 2319

## 2020-05-09 ENCOUNTER — Other Ambulatory Visit: Payer: Self-pay | Admitting: Unknown Physician Specialty

## 2020-05-09 ENCOUNTER — Telehealth: Payer: Self-pay | Admitting: Unknown Physician Specialty

## 2020-05-09 DIAGNOSIS — E1159 Type 2 diabetes mellitus with other circulatory complications: Secondary | ICD-10-CM

## 2020-05-09 DIAGNOSIS — E66812 Obesity, class 2: Secondary | ICD-10-CM

## 2020-05-09 DIAGNOSIS — U071 COVID-19: Secondary | ICD-10-CM

## 2020-05-09 DIAGNOSIS — E669 Obesity, unspecified: Secondary | ICD-10-CM

## 2020-05-09 LAB — SARS CORONAVIRUS 2 (TAT 6-24 HRS): SARS Coronavirus 2: POSITIVE — AB

## 2020-05-09 NOTE — Telephone Encounter (Signed)
I connected by phone with Sara Wood on 05/09/2020 at 2:44 PM to discuss the potential use of a new treatment for mild to moderate COVID-19 viral infection in non-hospitalized patients.  This patient is a 54 y.o. female that meets the FDA criteria for Emergency Use Authorization of COVID monoclonal antibody casirivimab/imdevimab.  Has a (+) direct SARS-CoV-2 viral test result  Has mild or moderate COVID-19   Is NOT hospitalized due to COVID-19  Is within 10 days of symptom onset  Has at least one of the high risk factor(s) for progression to severe COVID-19 and/or hospitalization as defined in EUA.  Specific high risk criteria : BMI > 25 and Cardiovascular disease or hypertension   I have spoken and communicated the following to the patient or parent/caregiver regarding COVID monoclonal antibody treatment:  1. FDA has authorized the emergency use for the treatment of mild to moderate COVID-19 in adults and pediatric patients with positive results of direct SARS-CoV-2 viral testing who are 21 years of age and older weighing at least 40 kg, and who are at high risk for progressing to severe COVID-19 and/or hospitalization.  2. The significant known and potential risks and benefits of COVID monoclonal antibody, and the extent to which such potential risks and benefits are unknown.  3. Information on available alternative treatments and the risks and benefits of those alternatives, including clinical trials.  4. Patients treated with COVID monoclonal antibody should continue to self-isolate and use infection control measures (e.g., wear mask, isolate, social distance, avoid sharing personal items, clean and disinfect "high touch" surfaces, and frequent handwashing) according to CDC guidelines.   5. The patient or parent/caregiver has the option to accept or refuse COVID monoclonal antibody treatment.  After reviewing this information with the patient, The patient agreed to proceed with  receiving casirivimab\imdevimab infusion and will be provided a copy of the Fact sheet prior to receiving the infusion. [name] 05/09/2020 2:44 PM  Sx onset 9/13

## 2020-05-10 ENCOUNTER — Telehealth: Payer: Self-pay | Admitting: Physician Assistant

## 2020-05-10 ENCOUNTER — Ambulatory Visit (HOSPITAL_COMMUNITY)
Admission: RE | Admit: 2020-05-10 | Discharge: 2020-05-10 | Disposition: A | Discharge status: Home / Self Care | Payer: BC Managed Care – PPO | Source: Ambulatory Visit | Attending: Pulmonary Disease | Admitting: Pulmonary Disease

## 2020-05-10 DIAGNOSIS — Z6836 Body mass index (BMI) 36.0-36.9, adult: Secondary | ICD-10-CM | POA: Diagnosis present

## 2020-05-10 DIAGNOSIS — E1159 Type 2 diabetes mellitus with other circulatory complications: Secondary | ICD-10-CM | POA: Diagnosis present

## 2020-05-10 DIAGNOSIS — E669 Obesity, unspecified: Secondary | ICD-10-CM | POA: Insufficient documentation

## 2020-05-10 DIAGNOSIS — I10 Essential (primary) hypertension: Secondary | ICD-10-CM | POA: Diagnosis present

## 2020-05-10 DIAGNOSIS — U071 COVID-19: Secondary | ICD-10-CM | POA: Diagnosis present

## 2020-05-10 DIAGNOSIS — I152 Hypertension secondary to endocrine disorders: Secondary | ICD-10-CM

## 2020-05-10 MED ORDER — SODIUM CHLORIDE 0.9 % IV SOLN: INTRAVENOUS | Status: DC | PRN

## 2020-05-10 MED ORDER — METHYLPREDNISOLONE SODIUM SUCC 125 MG IJ SOLR: 125.0000 mg | Freq: Once | INTRAMUSCULAR | Status: DC | PRN

## 2020-05-10 MED ORDER — ALBUTEROL SULFATE HFA 108 (90 BASE) MCG/ACT IN AERS: 2.0000 | INHALATION_SPRAY | Freq: Once | RESPIRATORY_TRACT | Status: DC | PRN

## 2020-05-10 MED ORDER — FAMOTIDINE IN NACL 20-0.9 MG/50ML-% IV SOLN: 20.0000 mg | Freq: Once | INTRAVENOUS | Status: DC | PRN

## 2020-05-10 MED ORDER — SODIUM CHLORIDE 0.9 % IV SOLN
1200.0000 mg | Freq: Once | INTRAVENOUS | Status: AC
  Administered 2020-05-10: 1200 mg via INTRAVENOUS

## 2020-05-10 MED ORDER — EPINEPHRINE 0.3 MG/0.3ML IJ SOAJ: 0.3000 mg | Freq: Once | INTRAMUSCULAR | Status: DC | PRN

## 2020-05-10 MED ORDER — DIPHENHYDRAMINE HCL 50 MG/ML IJ SOLN: 50.0000 mg | Freq: Once | INTRAMUSCULAR | Status: DC | PRN

## 2020-05-10 NOTE — Telephone Encounter (Signed)
entered in error   

## 2020-05-10 NOTE — Discharge Instructions (Signed)

## 2020-05-10 NOTE — Progress Notes (Signed)
  Diagnosis: COVID-19  Physician:DR. Joya Gaskins  Procedure: [facility] Med: casirivimab\imdevimab infusion - Provided patient with casirivimab\imdevimab fact sheet for patients, parents and caregivers prior to infusion.  Complications: No immediate complications noted.  Discharge: Discharged home   Sara Wood 05/10/2020

## 2020-05-23 ENCOUNTER — Other Ambulatory Visit: Payer: Self-pay | Admitting: Advanced Practice Midwife

## 2020-08-11 ENCOUNTER — Other Ambulatory Visit: Payer: Self-pay

## 2020-08-11 ENCOUNTER — Ambulatory Visit
Admission: EM | Admit: 2020-08-11 | Discharge: 2020-08-11 | Disposition: A | Discharge status: Home / Self Care | Payer: BC Managed Care – PPO | Attending: Family Medicine | Admitting: Family Medicine

## 2020-08-11 ENCOUNTER — Encounter: Payer: Self-pay | Admitting: Gynecology

## 2020-08-11 DIAGNOSIS — N3000 Acute cystitis without hematuria: Secondary | ICD-10-CM | POA: Insufficient documentation

## 2020-08-11 LAB — URINALYSIS, COMPLETE (UACMP) WITH MICROSCOPIC
Bilirubin Urine: NEGATIVE
Glucose, UA: NEGATIVE mg/dL
Hgb urine dipstick: NEGATIVE
Ketones, ur: 15 mg/dL — AB
Nitrite: NEGATIVE
Protein, ur: NEGATIVE mg/dL
Specific Gravity, Urine: >1.03 — ABNORMAL HIGH (ref 1.005–1.030)
pH: 5 (ref 5.0–8.0)

## 2020-08-11 MED ORDER — CEPHALEXIN 500 MG PO CAPS: 500.0000 mg | ORAL_CAPSULE | Freq: Two times a day (BID) | ORAL | 0 refills | Status: DC

## 2020-08-11 NOTE — ED Triage Notes (Signed)
Patient c/o lower back pain/ urge to urinate x over 1 week.

## 2020-08-12 NOTE — ED Provider Notes (Signed)
[facility]    CSN: [id number] Arrival date & time: 08/11/20  1145      History   Chief Complaint Chief Complaint  Patient presents with   Urinary Tract Infection   HPI  54 year old female presents with concern for UTI.  Patient reports a 1 week history of symptoms.  She reports ongoing back pain and urgency and frequency.  She states that she has had some associated dizziness as well.  No fever.  No nausea vomiting.  No flank pain.  No relieving factors.  No other associated symptoms.  No other complaints.  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   Obesity, unspecified 2013    Past Surgical History:  Procedure Laterality Date   BREAST BIOPSY Left 08/02/2012   positive, stereo   BREAST EXCISIONAL BIOPSY Left 08/24/2012   radiation   BREAST LUMPECTOMY Left 2013   BREAST SURGERY Left 08/24/2012   Wide excision left breast DCIS   BREAST SURGERY  09/03/2012   Re excision of both medial and lateral margins on left breast   CESAREAN SECTION  1991,1995,2000   TUBAL LIGATION      OB History    Gravida  4   Para  3   Term  3   Preterm      AB  1   Living  3     SAB  1   IAB      Ectopic      Multiple      Live Births  3        Obstetric Comments  Age first menstruation-14 Age first pregnancy-22 LMP-07/15/12         Home Medications     Prior to Admission medications   Medication Sig Start Date End Date Taking? Authorizing Provider  glipiZIDE (GLUCOTROL) 10 MG tablet Take 1 tablet (10 mg total) by mouth 2 (two) times daily before a meal. 08/25/19  Yes Rod Can, CNM  cephALEXin (KEFLEX) 500 MG capsule Take 1 capsule (500 mg total) by mouth 2 (two) times daily. 08/11/20   Coral Spikes, DO    Family History Family History  Problem Relation Age of Onset   Colon polyps Father    Breast cancer Neg Hx     Social History Social History   Tobacco Use   Smoking status: Never Smoker   Smokeless tobacco: Never Used  Vaping Use   Vaping Use: Never used  Substance Use Topics   Alcohol use: No   Drug use: No     Allergies   Patient has no known allergies.   Review of Systems Review of Systems Per HPI  Physical Exam Triage Vital Signs ED Triage Vitals  Enc Vitals Group     BP 08/11/20 1225 136/78     Pulse Rate 08/11/20 1225 76     Resp 08/11/20 1225 16     Temp  08/11/20 1225 98.6 F (37 C)     Temp Source 08/11/20 1225 Oral     SpO2 08/11/20 1225 100 %     Weight --      Height --      Head Circumference --      Peak Flow --      Pain Score 08/11/20 1256 2     Pain Loc --      Pain Edu? --      Excl. in [place]? --    Updated Vital Signs BP 136/78 (BP Location: Left Arm)    Pulse 76    Temp 98.6 F (37 C) (Oral)    Resp 16    LMP 11/26/2012    SpO2 100%   Visual Acuity Right Eye Distance:   Left Eye Distance:   Bilateral Distance:    Right Eye Near:   Left Eye Near:    Bilateral Near:     Physical Exam Constitutional:      General: She is not in acute distress.    Appearance: Normal appearance. She is not ill-appearing.  HENT:     Head: Normocephalic and atraumatic.  Eyes:     General:        Right eye: No discharge.        Left eye: No discharge.     Conjunctiva/sclera: Conjunctivae normal.  Cardiovascular:     Rate and Rhythm: Normal rate and regular rhythm.   Pulmonary:     Effort: Pulmonary effort is normal.     Breath sounds: Normal breath sounds. No wheezing, rhonchi or rales.  Abdominal:     General: There is no distension.     Palpations: Abdomen is soft.     Tenderness: There is no abdominal tenderness.  Neurological:     Mental Status: She is alert.  Psychiatric:        Mood and Affect: Mood normal.        Behavior: Behavior normal.    UC Treatments / Results  Labs (all labs ordered are listed, but only abnormal results are displayed) Labs Reviewed  URINALYSIS, COMPLETE (UACMP) WITH MICROSCOPIC - Abnormal; Notable for the following components:      Result Value   APPearance HAZY (*)    Specific Gravity, Urine >1.030 (*)    Ketones, ur 15 (*)    Leukocytes,Ua TRACE (*)    Bacteria, UA FEW (*)    All other components within normal limits  URINE CULTURE    EKG   Radiology No results found.  Procedures Procedures (including critical care time)  Medications Ordered in UC Medications - No data to display  Initial Impression / Assessment and Plan / UC Course  I have reviewed the triage vital signs and the nursing notes.  Pertinent labs & imaging results that were available during my care of the patient were reviewed by me and considered in my medical decision making (see chart for details).    54 year old female presents with UTI.  Placing on Keflex.  Awaiting culture.  Final Clinical Impressions(s) / UC Diagnoses   Final diagnoses:  Acute cystitis without hematuria   Discharge Instructions   None    ED Prescriptions    Medication Sig Dispense Auth. Provider   cephALEXin (KEFLEX) 500 MG capsule Take 1 capsule (500 mg total) by mouth 2 (two) times daily. 14 capsule Thersa Salt G, DO     PDMP not reviewed this encounter.   Coral Spikes, [place] 08/12/20 [phone]

## 2020-08-13 LAB — URINE CULTURE: Culture: >=100000 — AB

## 2020-09-10 ENCOUNTER — Ambulatory Visit: Payer: BC Managed Care – PPO | Admitting: Advanced Practice Midwife

## 2020-09-13 ENCOUNTER — Ambulatory Visit (INDEPENDENT_AMBULATORY_CARE_PROVIDER_SITE_OTHER): Payer: BC Managed Care – PPO | Admitting: Advanced Practice Midwife

## 2020-09-13 ENCOUNTER — Other Ambulatory Visit: Payer: Self-pay | Admitting: Advanced Practice Midwife

## 2020-09-13 ENCOUNTER — Ambulatory Visit: Payer: BC Managed Care – PPO | Admitting: Advanced Practice Midwife

## 2020-09-13 ENCOUNTER — Other Ambulatory Visit: Payer: Self-pay

## 2020-09-13 ENCOUNTER — Encounter: Payer: Self-pay | Admitting: Advanced Practice Midwife

## 2020-09-13 ENCOUNTER — Other Ambulatory Visit (HOSPITAL_COMMUNITY)
Admission: RE | Admit: 2020-09-13 | Discharge: 2020-09-13 | Disposition: A | Discharge status: Home / Self Care | Payer: BC Managed Care – PPO | Source: Ambulatory Visit | Attending: Advanced Practice Midwife | Admitting: Advanced Practice Midwife

## 2020-09-13 VITALS — BP 130/80 | Ht 64.0 in | Wt 202.0 lb

## 2020-09-13 DIAGNOSIS — Z8616 Personal history of COVID-19: Secondary | ICD-10-CM | POA: Diagnosis not present

## 2020-09-13 DIAGNOSIS — Z130 Encounter for screening for diseases of the blood and blood-forming organs and certain disorders involving the immune mechanism: Secondary | ICD-10-CM

## 2020-09-13 DIAGNOSIS — R5383 Other fatigue: Secondary | ICD-10-CM | POA: Diagnosis not present

## 2020-09-13 DIAGNOSIS — Z13220 Encounter for screening for lipoid disorders: Secondary | ICD-10-CM

## 2020-09-13 DIAGNOSIS — E1139 Type 2 diabetes mellitus with other diabetic ophthalmic complication: Secondary | ICD-10-CM

## 2020-09-13 DIAGNOSIS — Z01419 Encounter for gynecological examination (general) (routine) without abnormal findings: Secondary | ICD-10-CM | POA: Diagnosis present

## 2020-09-13 DIAGNOSIS — Z124 Encounter for screening for malignant neoplasm of cervix: Secondary | ICD-10-CM | POA: Diagnosis not present

## 2020-09-13 DIAGNOSIS — Z131 Encounter for screening for diabetes mellitus: Secondary | ICD-10-CM

## 2020-09-13 DIAGNOSIS — Z1239 Encounter for other screening for malignant neoplasm of breast: Secondary | ICD-10-CM

## 2020-09-13 DIAGNOSIS — Z0000 Encounter for general adult medical examination without abnormal findings: Secondary | ICD-10-CM

## 2020-09-13 DIAGNOSIS — E119 Type 2 diabetes mellitus without complications: Secondary | ICD-10-CM | POA: Insufficient documentation

## 2020-09-13 MED ORDER — GLIPIZIDE 10 MG PO TABS: 10.0000 mg | ORAL_TABLET | Freq: Two times a day (BID) | ORAL | 11 refills | Status: DC

## 2020-09-13 NOTE — Progress Notes (Addendum)
Sara Annual Exam  PCP: Abran Richard, Sara  Chief Complaint:  Chief Complaint  Patient presents with  . Annual Exam    History of Present Illness:Patient is a 55 y.o. [id number] presents for annual exam. The patient has no complaints today. She requests routine lab work today. She had covid in September last year without long term effects. She requests antibody testing.   LMP: Patient's last menstrual period was 11/26/2012.  Postcoital Bleeding: no Dysmenorrhea: not applicable  The patient is sexually active. She denies dyspareunia.  The patient does perform self breast exams.  There is notable family history of breast or ovarian cancer in her family. Patient has history of Ductal Carcinoma in situ.  The patient wears seatbelts: yes.   The patient has regular exercise: primarily sedentary lifestyle, she tries to eat healthy and stay hydrated, she admits adequate sleep.    The patient denies current symptoms of depression.     Review of Systems: Review of Systems  Constitutional: Negative for chills and fever.  HENT: Negative for congestion, ear discharge, ear pain, hearing loss, sinus pain and sore throat.   Eyes: Negative for blurred vision and double vision.  Respiratory: Negative for cough, shortness of breath and wheezing.   Cardiovascular: Negative for chest pain, palpitations and leg swelling.  Gastrointestinal: Negative for abdominal pain, blood in stool, constipation, diarrhea, heartburn, melena, nausea and vomiting.  Genitourinary: Negative for dysuria, flank pain, frequency, hematuria and urgency.  Musculoskeletal: Negative for back pain, joint pain and myalgias.  Skin: Negative for itching and rash.  Neurological: Negative for dizziness, tingling, tremors, sensory change, speech change, focal weakness, seizures, loss of consciousness, weakness and headaches.  Endo/Heme/Allergies: Negative for environmental allergies. Does not bruise/bleed easily.   Psychiatric/Behavioral: Negative for depression, hallucinations, memory loss, substance abuse and suicidal ideas. The patient is not nervous/anxious and does not have insomnia.     Past Medical History:  Patient Active Problem List   Diagnosis Date Noted  . Type 2 diabetes mellitus without complication ([place]) 06/10/5101  . 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 Whole breast radiation spring 2014.   . Obesity, unspecified 2013    Past Surgical History:  Past Surgical History:  Procedure Laterality Date  . BREAST BIOPSY Left 08/02/2012   positive, stereo  . BREAST EXCISIONAL BIOPSY Left 08/24/2012   radiation  . BREAST LUMPECTOMY Left 2013  . BREAST SURGERY Left 08/24/2012   Wide excision left breast DCIS  . BREAST SURGERY  09/03/2012   Re excision of both medial and lateral margins on left breast  . CESAREAN SECTION  1991,1995,2000  . TUBAL LIGATION      Gynecologic History:  Patient's last menstrual period was 11/26/2012. Last Pap: 3 years ago Results were:  no abnormalities  Last mammogram: 1 year ago Results were: BI-RAD I  Obstetric History: [id number]  Family History:  Family History  Problem Relation Age of Onset  . Colon polyps Father   . Breast cancer Neg Hx     Social History:  Social History   Socioeconomic History  . Marital status: Married    Spouse name: Not on file  . Number of children: Not on file  . Years of education: Not on file  . Highest education level: Not on file  Occupational History  . Not on file  Tobacco Use  . Smoking status: Never Smoker  . Smokeless tobacco: Never Used  Vaping Use  .  Vaping Use: Never used  Substance and Sexual Activity  . Alcohol use: No  . Drug use: No  . Sexual activity: Yes    Birth control/protection: Post-menopausal  Other Topics Concern  . Not on file  Social History Narrative  . Not on file   Social Determinants of Health   Financial  Resource Strain: Not on file  Food Insecurity: Not on file  Transportation Needs: Not on file  Physical Activity: Not on file  Stress: Not on file  Social Connections: Not on file  Intimate Partner Violence: Not on file    Allergies:  No Known Allergies  Medications: Prior to Admission medications   Medication Sig Start Date End Date Taking? Authorizing Provider  glipiZIDE (GLUCOTROL) 10 MG tablet Take 1 tablet (10 mg total) by mouth 2 (two) times daily before a meal. 09/13/20   Rod Can, CNM    Physical Exam Vitals: Blood pressure 130/80, height 5\' 4"  (1.626 m), weight 202 lb (91.6 kg), last menstrual period 11/26/2012.  General: NAD HEENT: normocephalic, anicteric Thyroid: no enlargement, no palpable nodules Pulmonary: No increased work of breathing, CTAB Cardiovascular: RRR, distal pulses 2+ Breast: Breast symmetrical, no tenderness, no palpable nodules or masses, no skin or nipple retraction present, no nipple discharge.  No axillary or supraclavicular lymphadenopathy. Abdomen: NABS, soft, non-tender, non-distended.  Umbilicus without lesions.  No hepatomegaly, splenomegaly or masses palpable. No evidence of hernia  Genitourinary:  External: Normal external female genitalia.  Normal urethral meatus, normal Bartholin's and Skene's glands.    Vagina: Normal vaginal mucosa, no evidence of prolapse.    Cervix: Grossly normal in appearance, no bleeding, no CMT  Uterus: Non-enlarged, mobile, normal contour.    Adnexa: ovaries non-enlarged, no adnexal masses  Rectal: deferred  Lymphatic: no evidence of inguinal lymphadenopathy Extremities: no edema, erythema, or tenderness Neurologic: Grossly intact Psychiatric: mood appropriate, affect full     Assessment: 55 y.o. [id number] routine annual exam  Plan: Problem List Items Addressed This Visit   None   Visit Diagnoses    Well woman exam with routine gynecological exam    -  Primary   Relevant Orders   SARS-CoV-2  Semi-Quantitative Total Antibody, Spike   Hgb A1c w/o eAG   TSH   Lipid Panel With LDL/HDL Ratio   Comprehensive metabolic panel   CBC   Cytology - PAP   MM DIGITAL SCREENING BILATERAL   History of COVID-19       Relevant Orders   SARS-CoV-2 Semi-Quantitative Total Antibody, Spike   Screening for cervical cancer       Relevant Orders   Cytology - PAP   Fatigue, unspecified type       Relevant Orders   TSH   Comprehensive metabolic panel   CBC   Screening for iron deficiency anemia       Relevant Orders   CBC   Screening for diabetes mellitus       Relevant Orders   Hgb A1c w/o eAG   Comprehensive metabolic panel   Lipid screening       Relevant Orders   Lipid Panel With LDL/HDL Ratio   Blood tests for routine general physical examination       Relevant Orders   Hgb A1c w/o eAG   TSH   Lipid Panel With LDL/HDL Ratio   Comprehensive metabolic panel   CBC   Type 2 diabetes mellitus with other ophthalmic complication, without long-term current use of insulin (HCC)  Relevant Medications   glipiZIDE (GLUCOTROL) 10 MG tablet   Breast screening       Relevant Orders   MM DIGITAL SCREENING BILATERAL      1) Mammogram - recommend yearly screening mammogram.  Mammogram Was ordered today  2) STI screening  was offered and declined  3) ASCCP guidelines and rationale discussed.  Patient opts for every 5 years screening interval after today's PAP  4) Osteoporosis  - per USPTF routine screening DEXA at age 65  Consider FDA-approved medical therapies in postmenopausal women and men aged 69 years and older, based on the following: a) A hip or vertebral (clinical or morphometric) fracture b) T-score ? -2.5 at the femoral neck or spine after appropriate evaluation to exclude secondary causes C) Low bone mass (T-score between -1.0 and -2.5 at the femoral neck or spine) and a 10-year probability of a hip fracture ? 3% or a 10-year probability of a major osteoporosis-related  fracture ? 20% based on the [place]-adapted WHO algorithm   5) Routine healthcare maintenance including cholesterol, diabetes screening discussed Ordered today  6) Colonoscopy: up to date. Normal result last year.  Screening recommended starting at age 24 for average risk individuals, age 75 for individuals deemed at increased risk (including African Americans) and recommended to continue until age 12.  For patient age 29-85 individualized approach is recommended.  Gold standard screening is via colonoscopy, Cologuard screening is an acceptable alternative for patient unwilling or unable to undergo colonoscopy.  "Colorectal cancer screening for average?risk adults: 2018 guideline update from the American Cancer Society"[place]: A Cancer Journal for Clinicians: Jan 21, 2017   7) Healthy lifestyle: increase physical activity  8) Return in about 1 year (around 09/13/2021) for annual established gyn.    Christean Leaf, CNM [facility] 09/13/20, 4:10 PM

## 2020-09-14 LAB — TSH: TSH: 2.91 u[IU]/mL (ref 0.450–4.500)

## 2020-09-14 LAB — SARS-COV-2 SEMI-QUANTITATIVE TOTAL ANTIBODY, SPIKE
SARS-CoV-2 Semi-Quant Total Ab: >2500 U/mL (ref <0.8)
SARS-CoV-2 Spike Ab Interp: POSITIVE

## 2020-09-14 LAB — COMPREHENSIVE METABOLIC PANEL
ALT: 7 IU/L (ref 0–32)
AST: 12 IU/L (ref 0–40)
Albumin/Globulin Ratio: 1.4 (ref 1.2–2.2)
Albumin: 4.2 g/dL (ref 3.8–4.9)
Alkaline Phosphatase: 71 IU/L (ref 44–121)
BUN/Creatinine Ratio: 25 — ABNORMAL HIGH (ref 9–23)
BUN: 18 mg/dL (ref 6–24)
Bilirubin Total: <0.2 mg/dL (ref 0.0–1.2)
CO2: 25 mmol/L (ref 20–29)
Calcium: 9.9 mg/dL (ref 8.7–10.2)
Chloride: 98 mmol/L (ref 96–106)
Creatinine, Ser: 0.71 mg/dL (ref 0.57–1.00)
GFR calc Af Amer: 112 mL/min/{1.73_m2} (ref >59)
GFR calc non Af Amer: 98 mL/min/{1.73_m2} (ref >59)
Globulin, Total: 3.1 g/dL (ref 1.5–4.5)
Glucose: 230 mg/dL — ABNORMAL HIGH (ref 65–99)
Potassium: 4.1 mmol/L (ref 3.5–5.2)
Sodium: 137 mmol/L (ref 134–144)
Total Protein: 7.3 g/dL (ref 6.0–8.5)

## 2020-09-14 LAB — LIPID PANEL WITH LDL/HDL RATIO
Cholesterol, Total: 280 mg/dL — ABNORMAL HIGH (ref 100–199)
HDL: 27 mg/dL — ABNORMAL LOW (ref >39)
LDL Chol Calc (NIH): 151 mg/dL — ABNORMAL HIGH (ref 0–99)
LDL/HDL Ratio: 5.6 ratio — ABNORMAL HIGH (ref 0.0–3.2)
Triglycerides: 529 mg/dL — ABNORMAL HIGH (ref 0–149)
VLDL Cholesterol Cal: 102 mg/dL — ABNORMAL HIGH (ref 5–40)

## 2020-09-14 LAB — CBC
Hematocrit: 39.5 % (ref 34.0–46.6)
Hemoglobin: 13 g/dL (ref 11.1–15.9)
MCH: 28.2 pg (ref 26.6–33.0)
MCHC: 32.9 g/dL (ref 31.5–35.7)
MCV: 86 fL (ref 79–97)
Platelets: 363 10*3/uL (ref 150–450)
RBC: 4.61 x10E6/uL (ref 3.77–5.28)
RDW: 12.8 % (ref 11.7–15.4)
WBC: 8.8 10*3/uL (ref 3.4–10.8)

## 2020-09-14 LAB — HGB A1C W/O EAG: Hgb A1c MFr Bld: 8 % — ABNORMAL HIGH (ref 4.8–5.6)

## 2020-09-18 LAB — CYTOLOGY - PAP
Comment: NEGATIVE
Diagnosis: NEGATIVE
High risk HPV: NEGATIVE

## 2020-10-01 ENCOUNTER — Other Ambulatory Visit: Payer: Self-pay | Admitting: Advanced Practice Midwife

## 2020-10-01 DIAGNOSIS — Z1231 Encounter for screening mammogram for malignant neoplasm of breast: Secondary | ICD-10-CM

## 2020-10-09 ENCOUNTER — Other Ambulatory Visit: Payer: Self-pay

## 2020-10-09 ENCOUNTER — Ambulatory Visit
Admission: RE | Admit: 2020-10-09 | Discharge: 2020-10-09 | Disposition: A | Discharge status: Home / Self Care | Payer: BC Managed Care – PPO | Source: Ambulatory Visit | Attending: Advanced Practice Midwife | Admitting: Advanced Practice Midwife

## 2020-10-09 DIAGNOSIS — Z1231 Encounter for screening mammogram for malignant neoplasm of breast: Secondary | ICD-10-CM | POA: Insufficient documentation

## 2021-08-29 ENCOUNTER — Encounter: Payer: Self-pay | Admitting: Advanced Practice Midwife

## 2021-09-02 ENCOUNTER — Other Ambulatory Visit: Payer: Self-pay | Admitting: Advanced Practice Midwife

## 2021-09-02 DIAGNOSIS — Z1239 Encounter for other screening for malignant neoplasm of breast: Secondary | ICD-10-CM

## 2021-09-02 NOTE — Progress Notes (Signed)
Screening mammogram ordered

## 2021-10-17 ENCOUNTER — Other Ambulatory Visit: Payer: Self-pay

## 2021-10-17 ENCOUNTER — Ambulatory Visit
Admission: RE | Admit: 2021-10-17 | Discharge: 2021-10-17 | Disposition: A | Discharge status: Home / Self Care | Payer: BC Managed Care – PPO | Source: Ambulatory Visit | Attending: Advanced Practice Midwife | Admitting: Advanced Practice Midwife

## 2021-10-17 DIAGNOSIS — Z1231 Encounter for screening mammogram for malignant neoplasm of breast: Secondary | ICD-10-CM | POA: Diagnosis present

## 2021-10-17 DIAGNOSIS — Z1239 Encounter for other screening for malignant neoplasm of breast: Secondary | ICD-10-CM

## 2021-10-21 ENCOUNTER — Telehealth: Payer: Self-pay

## 2021-10-21 NOTE — Telephone Encounter (Signed)
Pt calling; had mammogram last week; has been called to get another one done; needs order; also needs refill of glipizide.  [phone]  courtesy call to let pt know JEG is not in today but is in tomorrow.  Message will be sent.

## 2021-10-22 ENCOUNTER — Other Ambulatory Visit: Payer: Self-pay | Admitting: Advanced Practice Midwife

## 2021-10-22 DIAGNOSIS — R928 Other abnormal and inconclusive findings on diagnostic imaging of breast: Secondary | ICD-10-CM

## 2021-10-22 DIAGNOSIS — N63 Unspecified lump in unspecified breast: Secondary | ICD-10-CM

## 2021-10-23 ENCOUNTER — Other Ambulatory Visit: Payer: Self-pay | Admitting: Advanced Practice Midwife

## 2021-10-23 ENCOUNTER — Telehealth: Payer: Self-pay | Admitting: Advanced Practice Midwife

## 2021-10-23 DIAGNOSIS — E1139 Type 2 diabetes mellitus with other diabetic ophthalmic complication: Secondary | ICD-10-CM

## 2021-10-23 MED ORDER — GLIPIZIDE 10 MG PO TABS: 10.0000 mg | ORAL_TABLET | Freq: Two times a day (BID) | ORAL | 11 refills | Status: AC

## 2021-10-23 NOTE — Progress Notes (Signed)
Refill glipizide. Also recommend patient have hgb a1c drawn. Mammo f/u orders signed. ?

## 2021-10-23 NOTE — Telephone Encounter (Signed)
Pt has been called back to [place] after her initial mammogram.  Can you sign the appropriate forms so that she can get this recalled appt scheduled?  She also needs a refill on the RX that she takes for her sugar (Glipside??).  Her MYChart is not working or she would have messaged you.   ?

## 2021-10-24 NOTE — Telephone Encounter (Signed)
Pt aware and tx'd to [place] to schedule annual. ?

## 2021-10-24 NOTE — Telephone Encounter (Signed)
lmtc

## 2021-11-11 ENCOUNTER — Other Ambulatory Visit: Payer: Self-pay

## 2021-11-11 ENCOUNTER — Other Ambulatory Visit: Payer: Self-pay | Admitting: Advanced Practice Midwife

## 2021-11-11 ENCOUNTER — Ambulatory Visit
Admission: RE | Admit: 2021-11-11 | Discharge: 2021-11-11 | Disposition: A | Discharge status: Home / Self Care | Payer: BC Managed Care – PPO | Source: Ambulatory Visit | Attending: Advanced Practice Midwife | Admitting: Advanced Practice Midwife

## 2021-11-11 DIAGNOSIS — N63 Unspecified lump in unspecified breast: Secondary | ICD-10-CM

## 2021-11-11 DIAGNOSIS — R928 Other abnormal and inconclusive findings on diagnostic imaging of breast: Secondary | ICD-10-CM

## 2021-11-19 ENCOUNTER — Encounter: Payer: Self-pay | Admitting: Advanced Practice Midwife

## 2021-11-19 ENCOUNTER — Other Ambulatory Visit: Payer: Self-pay

## 2021-11-19 ENCOUNTER — Ambulatory Visit (INDEPENDENT_AMBULATORY_CARE_PROVIDER_SITE_OTHER): Payer: BC Managed Care – PPO | Admitting: Advanced Practice Midwife

## 2021-11-19 VITALS — BP 122/70 | Ht 64.0 in | Wt 205.0 lb

## 2021-11-19 DIAGNOSIS — Z0000 Encounter for general adult medical examination without abnormal findings: Secondary | ICD-10-CM

## 2021-11-19 DIAGNOSIS — Z130 Encounter for screening for diseases of the blood and blood-forming organs and certain disorders involving the immune mechanism: Secondary | ICD-10-CM

## 2021-11-19 DIAGNOSIS — Z131 Encounter for screening for diabetes mellitus: Secondary | ICD-10-CM

## 2021-11-19 DIAGNOSIS — J329 Chronic sinusitis, unspecified: Secondary | ICD-10-CM | POA: Insufficient documentation

## 2021-11-19 DIAGNOSIS — Z1329 Encounter for screening for other suspected endocrine disorder: Secondary | ICD-10-CM

## 2021-11-19 DIAGNOSIS — Z13220 Encounter for screening for lipoid disorders: Secondary | ICD-10-CM | POA: Diagnosis not present

## 2021-11-19 DIAGNOSIS — E119 Type 2 diabetes mellitus without complications: Secondary | ICD-10-CM

## 2021-11-19 NOTE — Progress Notes (Signed)
? ? ? ?Gynecology Annual Exam  ?PCP: Abran Richard, MD ? ?Chief Complaint:  ?Chief Complaint  ?Patient presents with  ? Annual Exam  ?  Pt has some questions about her last mammo  ? ? ?History of Present Illness:Patient is a 56 y.o. [id number] presents for annual exam. The patient has no gyn complaints today. We discussed her recent mammogram- 5 mm complex cyst in right breast likely benign with recommended follow up ultrasound in 6 months per Dr Miquel Dunn. ? ?LMP: Patient's last menstrual period was 11/26/2012. ? ? ?The patient is sexually active. She denies dyspareunia.  The patient does perform self breast exams.  There is notable family history of breast or ovarian cancer in her family. Patient has had ductal carcinoma in situ left breast. ? ?The patient wears seatbelts: yes.   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   ? ?The patient denies current symptoms of depression.    ? ?Review of Systems: Review of Systems  ?Constitutional:  Negative for chills and fever.  ?HENT:  Negative for congestion, ear discharge, ear pain, hearing loss, sinus pain and sore throat.   ?Eyes:  Negative for blurred vision and double vision.  ?Respiratory:  Negative for cough, shortness of breath and wheezing.   ?Cardiovascular:  Negative for chest pain, palpitations and leg swelling.  ?Gastrointestinal:  Negative for abdominal pain, blood in stool, constipation, diarrhea, heartburn, melena, nausea and vomiting.  ?Genitourinary:  Negative for dysuria, flank pain, frequency, hematuria and urgency.  ?Musculoskeletal:  Negative for back pain, joint pain and myalgias.  ?Skin:  Negative for itching and rash.  ?Neurological:  Negative for dizziness, tingling, tremors, sensory change, speech change, focal weakness, seizures, loss of  consciousness, weakness and headaches.  ?Endo/Heme/Allergies:  Negative for environmental allergies. Does not bruise/bleed easily.  ?Psychiatric/Behavioral:  Negative for depression, hallucinations, memory loss, substance abuse and suicidal ideas. The patient is not nervous/anxious and does not have insomnia.   ? ?Past Medical History:  ?Patient Active Problem List  ? Diagnosis Date Noted  ? Chronic sinusitis 11/19/2021  ? Type 2 diabetes mellitus without complication ([place]) 03/55/9741  ? 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  Left breast DCIS: 2013, reexcision, January 2014. Whole breast radiation spring 2014. ?  ? Obesity, unspecified 2013  ? ? ?Past Surgical History:  ?Past Surgical History:  ?Procedure Laterality Date  ? BREAST BIOPSY Left 08/02/2012  ? positive, stereo  ? BREAST EXCISIONAL BIOPSY Left 08/24/2012  ? radiation  ? BREAST LUMPECTOMY Left 2013  ? BREAST SURGERY Left 08/24/2012  ? Wide excision left breast DCIS  ? BREAST SURGERY  09/03/2012  ? Re excision of both medial and lateral margins on left breast  ? CESAREAN SECTION  1991,1995,2000  ? TUBAL LIGATION    ? ? ?Gynecologic History:  ?Patient's last menstrual period was 11/26/2012. ?Last Pap: 2022 Results were:  no abnormalities  ?Last mammogram: 2 months ago Results were: BI-RAD III probably benign ? ?Obstetric History: [id number] ? ?Family History:  ?Family History  ?Problem Relation Age of Onset  ? Colon polyps Father   ? Breast cancer Neg Hx   ? ? ?Social History:  ?Social History  ? ?Socioeconomic History  ? Marital status: Married  ?  Spouse name: Not  on file  ? Number of children: Not on file  ? Years of education: Not on file  ? Highest education level: Not on file  ?Occupational History  ? Not on file  ?Tobacco Use  ? Smoking status: Never  ? Smokeless tobacco: Never  ?Vaping Use  ? Vaping Use: Never used  ?Substance and Sexual Activity  ? Alcohol use: No  ? Drug use: No  ? Sexual activity: Yes  ?  Birth control/protection:  Post-menopausal  ?Other Topics Concern  ? Not on file  ?Social History Narrative  ? Not on file  ? ?Social Determinants of Health  ? ?Financial Resource Strain: Not on file  ?Food Insecurity: Not on file  ?Transportation Needs: Not on file  ?Physical Activity: Not on file  ?Stress: Not on file  ?Social Connections: Not on file  ?Intimate Partner Violence: Not on file  ? ? ?Allergies:  ?No Known Allergies ? ?Medications: ?Prior to Admission medications   ?Medication Sig Start Date End Date Taking? Authorizing Provider  ?glipiZIDE (GLUCOTROL) 10 MG tablet Take 1 tablet (10 mg total) by mouth 2 (two) times daily before a meal. 10/23/21   Rod Can, CNM  ? ? ?Physical Exam ?Vitals: Blood pressure 122/70, height '5\' 4"'$  (1.626 m), weight 205 lb (93 kg), last menstrual period 11/26/2012. ? ?General: NAD ?HEENT: normocephalic, anicteric ?Thyroid: no enlargement, no palpable nodules ?Pulmonary: No increased work of breathing, CTAB ?Cardiovascular: RRR, distal pulses 2+ ?Breast: Breast symmetrical, no tenderness, no palpable nodules or masses, no skin or nipple retraction present, no nipple discharge.  No axillary or supraclavicular lymphadenopathy. ?Abdomen: NABS, soft, non-tender, non-distended.  Umbilicus without lesions.  No hepatomegaly, splenomegaly or masses palpable. No evidence of hernia  ?Genitourinary: deferred for no concerns/PAP interval ?Extremities: no edema, erythema, or tenderness ?Neurologic: Grossly intact ?Psychiatric: mood appropriate, affect full ? ?  ?Assessment: 56 y.o. H7C1638 routine annual exam ? ?Plan: ?Problem List Items Addressed This Visit   ?None ?Visit Diagnoses   ? ? Well woman exam without gynecological exam    -  Primary  ? Relevant Orders  ? Hgb A1c w/o eAG  ? TSH  ? Comprehensive metabolic panel  ? CBC  ? Lipid Panel With LDL/HDL Ratio  ? Screening for diabetes mellitus      ? Relevant Orders  ? Hgb A1c w/o eAG  ? Screening for lipid disorders      ? Relevant Orders  ? Lipid Panel With  LDL/HDL Ratio  ? ?  ? ? ?1) Mammogram - recommend yearly screening mammogram.  Mammogram Is up to date; follow up recommended in 6 months ? ?2) STI screening  was offered and declined ? ?3) ASCCP guidelines and rationale discussed.  Patient opts for every 5 years screening interval ? ?4) Osteoporosis  ?- per USPTF routine screening DEXA at age 27 ? ?Consider FDA-approved medical therapies in postmenopausal women and men aged 70 years and older, based on the following: ?a) A hip or vertebral (clinical or morphometric) fracture ?b) T-score ? -2.5 at the femoral neck or spine after appropriate evaluation to exclude secondary causes ?C) Low bone mass (T-score between -1.0 and -2.5 at the femoral neck or spine) and a 10-year probability of a hip fracture ? 3% or a 10-year probability of a major osteoporosis-related fracture ? 20% based on the [place]-adapted WHO algorithm ? ? ?5) Routine healthcare maintenance including cholesterol, diabetes screening discussed Ordered today ? ?6) Colonoscopy up to date, done in 2022 normal.  Screening  recommended starting at age 54 for average risk individuals, age 6 for individuals deemed at increased risk (including African Americans) and recommended to continue until age 40.  For patient age 36-85 individualized approach is recommended.  Gold standard screening is via colonoscopy, Cologuard screening is an acceptable alternative for patient unwilling or unable to undergo colonoscopy.  "Colorectal cancer screening for average?risk adults: 2018 guideline update from the [place]: A Cancer Journal for Clinicians: Jan 21, 2017  ? ?7) Diabetes: continued discussion recommending care by endocrinologist, increase healthy lifestyle; diet, exercise ? ?8) Return in about 1 year (around 11/20/2022) for annual established gyn. ? ? ? ?Christean Leaf, CNM ?[facility] ?[facility] ?11/19/21, 2:02 PM ? ? ? ? ? ? ? ? ? ? ?  ?

## 2021-11-20 LAB — LIPID PANEL WITH LDL/HDL RATIO
Cholesterol, Total: 180 mg/dL (ref 100–199)
HDL: 20 mg/dL — ABNORMAL LOW (ref >39)
LDL Chol Calc (NIH): 97 mg/dL (ref 0–99)
LDL/HDL Ratio: 4.9 ratio — ABNORMAL HIGH (ref 0.0–3.2)
Triglycerides: 370 mg/dL — ABNORMAL HIGH (ref 0–149)
VLDL Cholesterol Cal: 63 mg/dL — ABNORMAL HIGH (ref 5–40)

## 2021-11-20 LAB — CBC
Hematocrit: 39 % (ref 34.0–46.6)
Hemoglobin: 12.9 g/dL (ref 11.1–15.9)
MCH: 27.9 pg (ref 26.6–33.0)
MCHC: 33.1 g/dL (ref 31.5–35.7)
MCV: 84 fL (ref 79–97)
Platelets: 372 10*3/uL (ref 150–450)
RBC: 4.62 x10E6/uL (ref 3.77–5.28)
RDW: 13 % (ref 11.7–15.4)
WBC: 8.3 10*3/uL (ref 3.4–10.8)

## 2021-11-20 LAB — COMPREHENSIVE METABOLIC PANEL
ALT: 20 IU/L (ref 0–32)
AST: 24 IU/L (ref 0–40)
Albumin/Globulin Ratio: 1.6 (ref 1.2–2.2)
Albumin: 4.2 g/dL (ref 3.8–4.9)
Alkaline Phosphatase: 80 IU/L (ref 44–121)
BUN/Creatinine Ratio: 23 (ref 9–23)
BUN: 16 mg/dL (ref 6–24)
Bilirubin Total: 0.2 mg/dL (ref 0.0–1.2)
CO2: 23 mmol/L (ref 20–29)
Calcium: 9.5 mg/dL (ref 8.7–10.2)
Chloride: 99 mmol/L (ref 96–106)
Creatinine, Ser: 0.71 mg/dL (ref 0.57–1.00)
Globulin, Total: 2.6 g/dL (ref 1.5–4.5)
Glucose: 241 mg/dL — ABNORMAL HIGH (ref 70–99)
Potassium: 4.3 mmol/L (ref 3.5–5.2)
Sodium: 140 mmol/L (ref 134–144)
Total Protein: 6.8 g/dL (ref 6.0–8.5)
eGFR: 101 mL/min/{1.73_m2} (ref >59)

## 2021-11-20 LAB — HGB A1C W/O EAG: Hgb A1c MFr Bld: 8.2 % — ABNORMAL HIGH (ref 4.8–5.6)

## 2021-11-20 LAB — TSH: TSH: 1.79 u[IU]/mL (ref 0.450–4.500)

## 2021-12-03 ENCOUNTER — Encounter: Payer: Self-pay | Admitting: Advanced Practice Midwife

## 2021-12-05 ENCOUNTER — Other Ambulatory Visit: Payer: Self-pay | Admitting: Advanced Practice Midwife

## 2021-12-05 DIAGNOSIS — E119 Type 2 diabetes mellitus without complications: Secondary | ICD-10-CM

## 2021-12-05 NOTE — Progress Notes (Signed)
Ambulatory referral to [facility]. ?

## 2021-12-11 NOTE — Telephone Encounter (Signed)
Im not sure here. Does she need you to order one?

## 2021-12-20 ENCOUNTER — Encounter: Payer: Self-pay | Admitting: Advanced Practice Midwife

## 2021-12-20 ENCOUNTER — Other Ambulatory Visit: Payer: Self-pay | Admitting: Advanced Practice Midwife

## 2021-12-20 DIAGNOSIS — N6313 Unspecified lump in the right breast, lower outer quadrant: Secondary | ICD-10-CM

## 2021-12-20 NOTE — Progress Notes (Signed)
6 month follow up limited right breast u/s ordered per [place]. Message to patient so she can schedule. ?

## 2022-01-01 ENCOUNTER — Encounter: Payer: Self-pay | Admitting: Advanced Practice Midwife

## 2022-02-21 ENCOUNTER — Encounter: Payer: Self-pay | Admitting: Advanced Practice Midwife

## 2022-03-14 ENCOUNTER — Telehealth: Payer: Self-pay

## 2022-03-14 NOTE — Telephone Encounter (Signed)
This can be sent to [place] and she is out of the office, can you let the pt know that please.

## 2022-03-14 NOTE — Telephone Encounter (Signed)
Patient contacted office to inquire in regards to [facility] bill. Patient states that she saw Opal Sidles for physical 11/19/21 and [facility] representative told her that diagnosis that was given for physical was a diagnostic code and needs to be a screening code for physical instead. Patient states in message she has Merwyn Katos on my chart about bill and states that she has called and left messages with no resolve. Patient is requesting a call back today with solution. Please advise. Patient can be reached at [phone]. KW

## 2022-03-21 ENCOUNTER — Telehealth: Payer: Self-pay

## 2022-03-21 NOTE — Telephone Encounter (Signed)
Needs a code for the TSH. Pt states that [place] told her this and the account number is [id number]. I called [facility] and added Z13.29 not sure why it wasn't listed on her bill it was in her chart. [facility] states to let the pt know to give it up to 30 days. I called and left message that code was added and could take up to 30 days.

## 2022-03-21 NOTE — Telephone Encounter (Signed)
I called [facility] and added the code for TSH. Pt aware.

## 2022-05-11 IMAGING — MG MM DIGITAL DIAGNOSTIC UNILAT*R* W/ TOMO W/ CAD
4 series · 4 of 12 positions shown · non-contrast
Comparison: Previous exam(s).

CLINICAL DATA: Patient was recalled from screening mammogram for
possible mass in the right breast.

EXAM:
DIGITAL DIAGNOSTIC UNILATERAL RIGHT MAMMOGRAM WITH TOMOSYNTHESIS AND
CAD; ULTRASOUND RIGHT BREAST LIMITED
TECHNIQUE: Right digital diagnostic mammography and breast tomosynthesis was
performed. The images were evaluated with computer-aided detection.;
Targeted ultrasound examination of the right breast was performed

[R CC synth-2D]
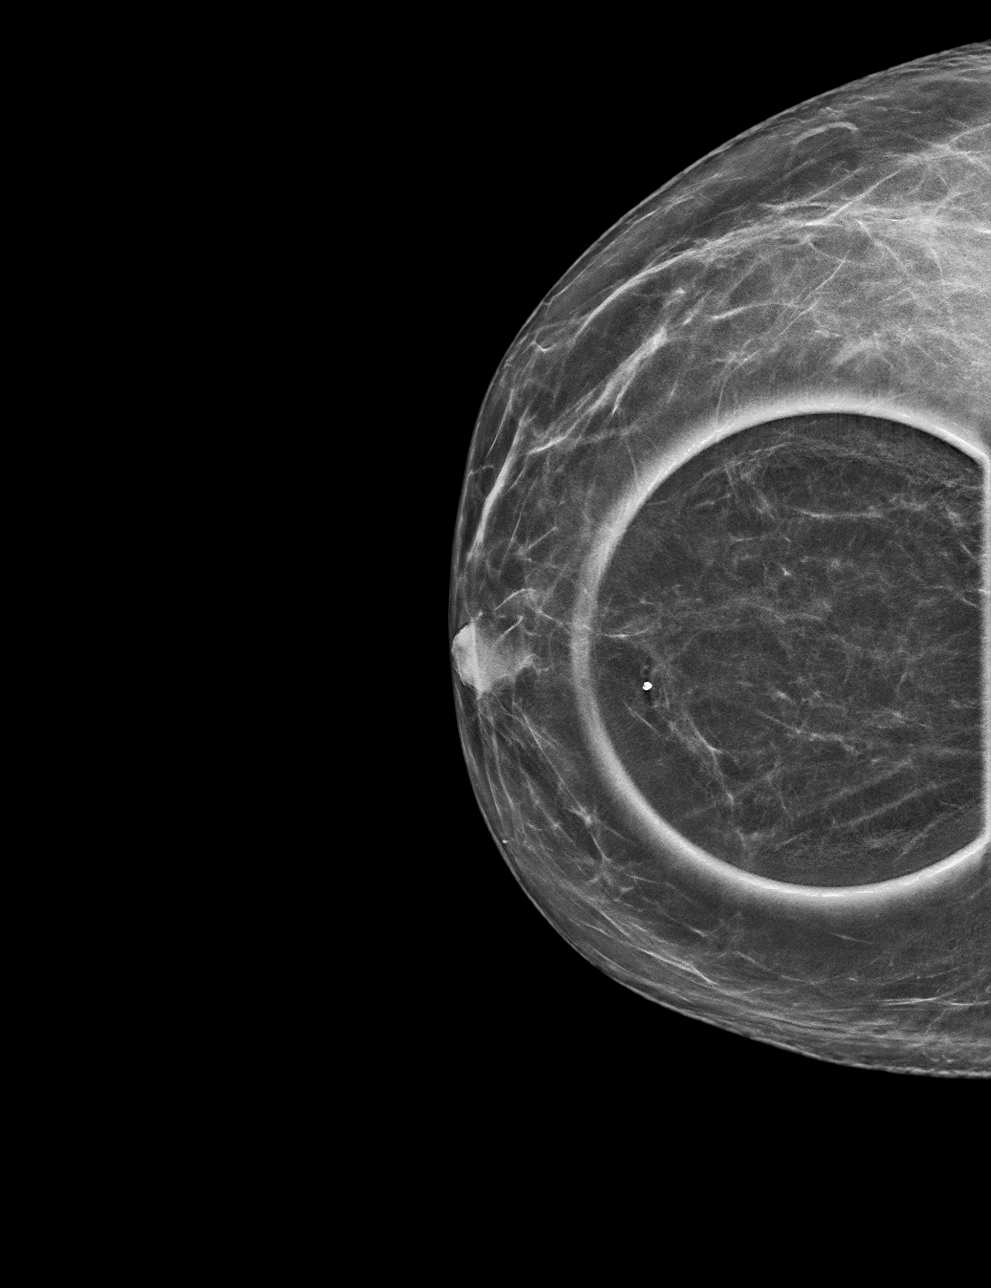

[R ML synth-2D]
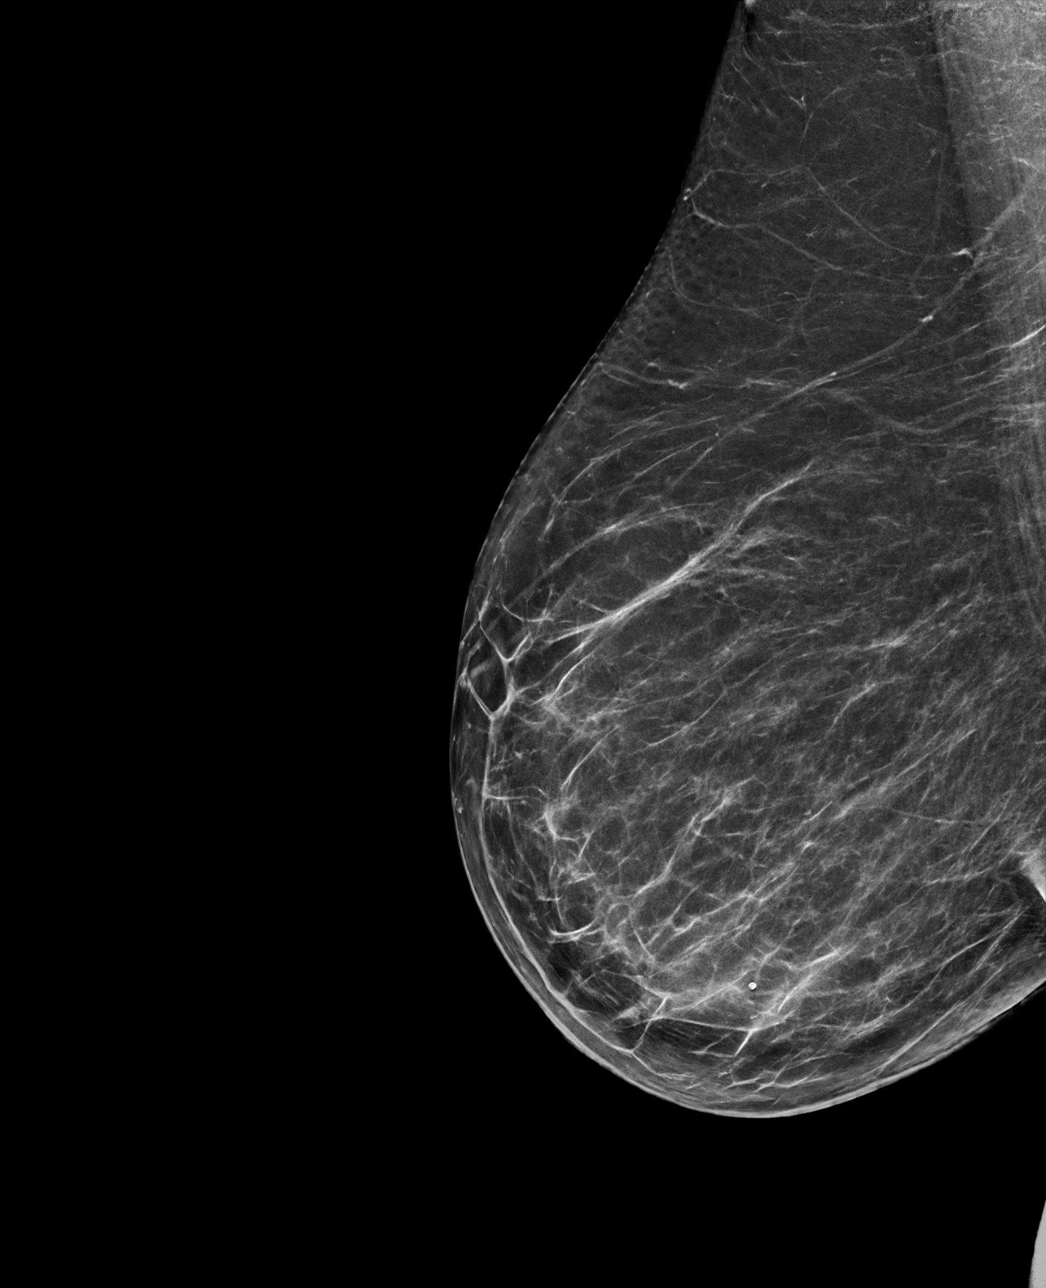

[R ML tomo · tomo slice 37/73.0]
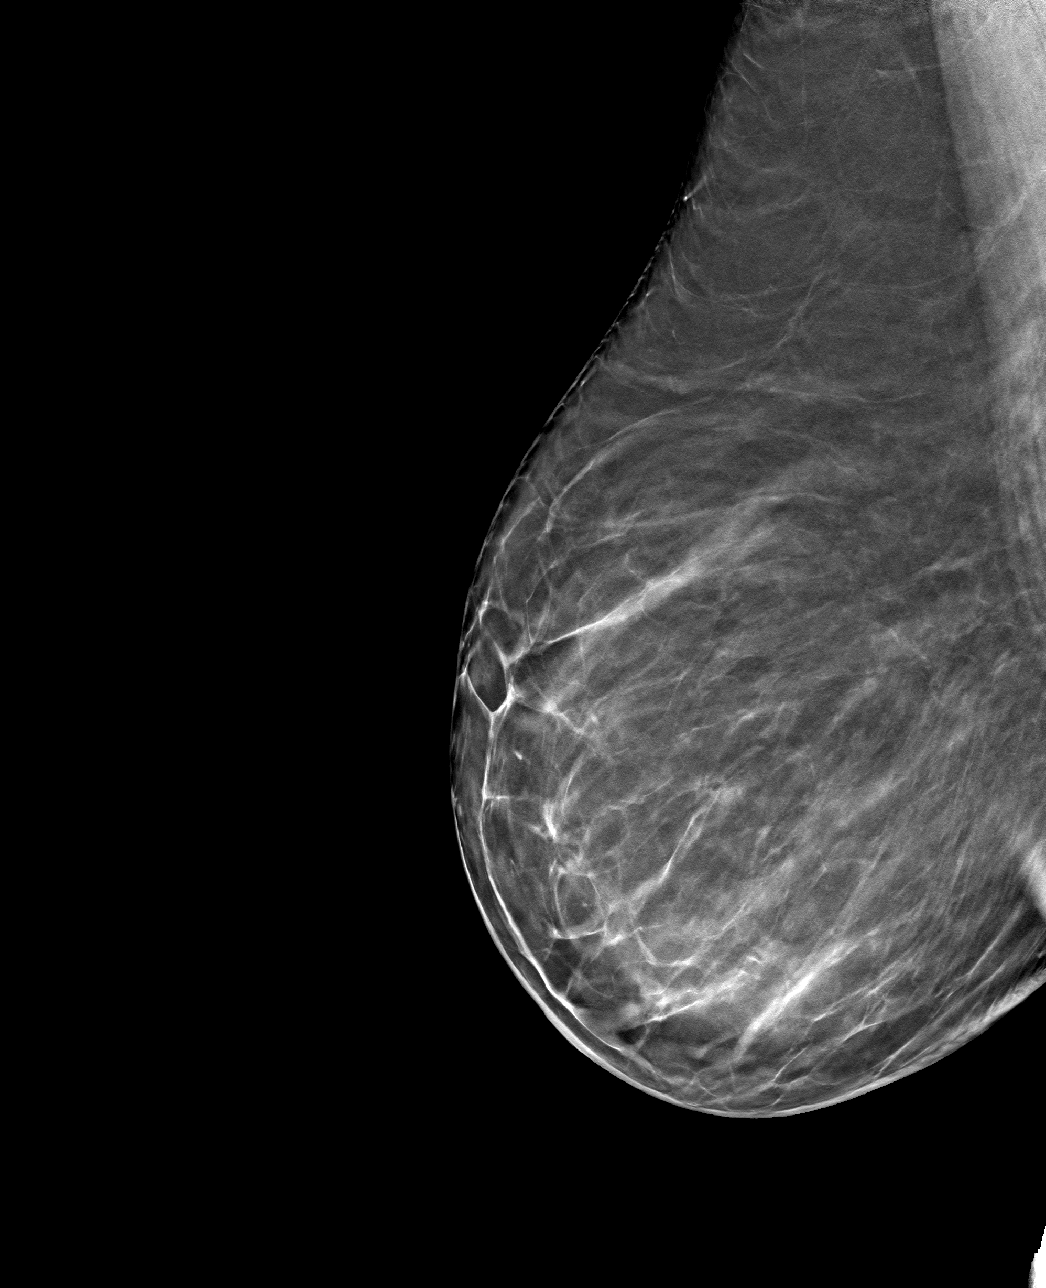

[R CC tomo · tomo slice 31/61.0]
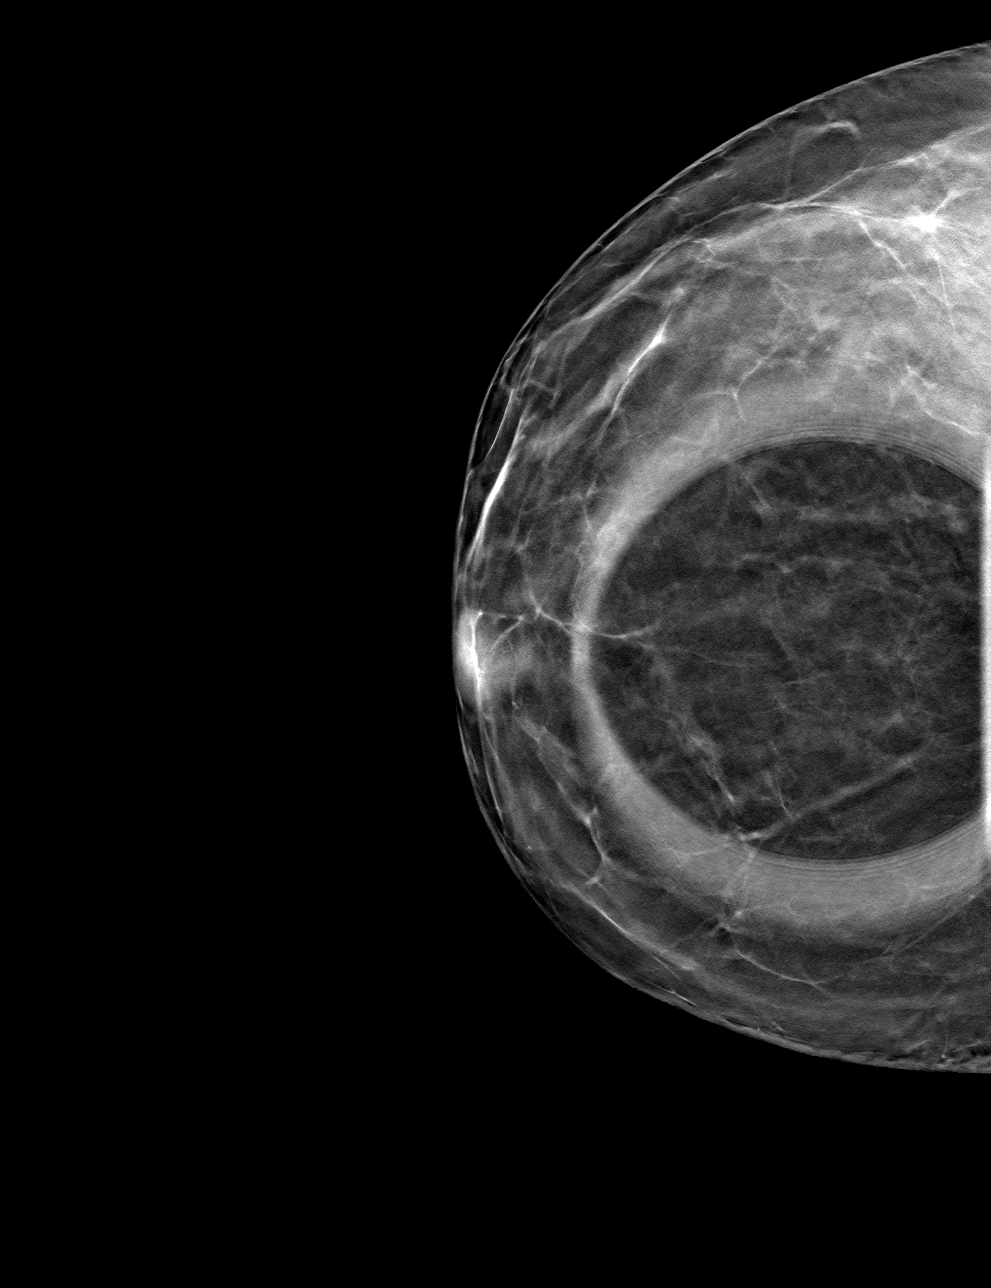

[4 of 12 positions shown; findings below may reference images not displayed]

ACR Breast Density Category b: There are scattered areas of
fibroglandular density.
FINDINGS: Additional imaging of the right breast was performed. There is
persistence of a 5 mm mass in the lateral periareolar region of the
right breast only seen on the cc view. There are no malignant type
microcalcifications.

Targeted ultrasound is performed, showing a well-circumscribed
hypoechoic mass in the right breast at 8 o'clock 1 cm from the
nipple measuring 5 x 2 x 4 mm. It is felt to likely be a benign
complex cyst.
IMPRESSION: Probable benign complex cyst in the 8 o'clock region of the right
breast.

RECOMMENDATION:
Short-term interval follow-up right breast ultrasound in 6 months is
recommended.

I have discussed the findings and recommendations with the patient.
If applicable, a reminder letter will be sent to the patient
regarding the next appointment.

BI-RADS CATEGORY  3: Probably benign.

## 2022-05-19 ENCOUNTER — Ambulatory Visit
Admission: RE | Admit: 2022-05-19 | Discharge: 2022-05-19 | Disposition: A | Discharge status: Home / Self Care | Payer: BC Managed Care – PPO | Source: Ambulatory Visit | Attending: Advanced Practice Midwife | Admitting: Advanced Practice Midwife

## 2022-05-19 DIAGNOSIS — N6313 Unspecified lump in the right breast, lower outer quadrant: Secondary | ICD-10-CM | POA: Diagnosis present

## 2022-09-22 ENCOUNTER — Other Ambulatory Visit: Payer: Self-pay | Admitting: Advanced Practice Midwife

## 2022-09-22 DIAGNOSIS — E1139 Type 2 diabetes mellitus with other diabetic ophthalmic complication: Secondary | ICD-10-CM

## 2022-11-03 ENCOUNTER — Other Ambulatory Visit: Payer: Self-pay | Admitting: Advanced Practice Midwife

## 2022-11-03 DIAGNOSIS — N63 Unspecified lump in unspecified breast: Secondary | ICD-10-CM

## 2022-11-03 DIAGNOSIS — Z1231 Encounter for screening mammogram for malignant neoplasm of breast: Secondary | ICD-10-CM

## 2022-11-24 ENCOUNTER — Ambulatory Visit
Admission: RE | Admit: 2022-11-24 | Discharge: 2022-11-24 | Disposition: A | Discharge status: Home / Self Care | Payer: BC Managed Care – PPO | Source: Ambulatory Visit | Attending: Advanced Practice Midwife | Admitting: Advanced Practice Midwife

## 2022-11-24 DIAGNOSIS — N63 Unspecified lump in unspecified breast: Secondary | ICD-10-CM

## 2022-11-24 DIAGNOSIS — Z1231 Encounter for screening mammogram for malignant neoplasm of breast: Secondary | ICD-10-CM | POA: Insufficient documentation

## 2023-09-14 ENCOUNTER — Other Ambulatory Visit: Payer: Self-pay | Admitting: Advanced Practice Midwife

## 2023-09-14 DIAGNOSIS — Z1231 Encounter for screening mammogram for malignant neoplasm of breast: Secondary | ICD-10-CM

## 2023-10-25 ENCOUNTER — Ambulatory Visit
Admission: EM | Admit: 2023-10-25 | Discharge: 2023-10-25 | Disposition: A | Discharge status: Home / Self Care | Attending: Emergency Medicine | Admitting: Emergency Medicine

## 2023-10-25 DIAGNOSIS — S39012A Strain of muscle, fascia and tendon of lower back, initial encounter: Secondary | ICD-10-CM | POA: Diagnosis not present

## 2023-10-25 DIAGNOSIS — J0140 Acute pansinusitis, unspecified: Secondary | ICD-10-CM

## 2023-10-25 LAB — URINALYSIS, W/ REFLEX TO CULTURE (INFECTION SUSPECTED)
Bilirubin Urine: NEGATIVE
Glucose, UA: NEGATIVE mg/dL
Hgb urine dipstick: NEGATIVE
Ketones, ur: 15 mg/dL — AB
Nitrite: NEGATIVE
Protein, ur: 30 mg/dL — AB
Specific Gravity, Urine: >1.03 — ABNORMAL HIGH (ref 1.005–1.030)
pH: 5 (ref 5.0–8.0)

## 2023-10-25 MED ORDER — AMOXICILLIN-POT CLAVULANATE 875-125 MG PO TABS: 1.0000 | ORAL_TABLET | Freq: Two times a day (BID) | ORAL | 0 refills | Status: AC

## 2023-10-25 MED ORDER — FLUTICASONE PROPIONATE 50 MCG/ACT NA SUSP: 2.0000 | Freq: Every day | NASAL | 0 refills | Status: AC

## 2023-10-25 NOTE — ED Triage Notes (Signed)
 Pt c/o possible sinus infection, leg and lower back pain, headache x1week  Pt has been using Ibuprofen for her headache and pain has gone away. Pt denies taking any ibuprofen today.  Pt denies any urinary issues or vaginal itching, burning, or discharge and is worried about a possible UTI.  Pt recently started taking [place] and is worried if this is a side effect of the Januvia.

## 2023-10-25 NOTE — ED Provider Notes (Signed)
 HPI  SUBJECTIVE:  Sara Wood is a 58 y.o. female who presents with 2 issues:  First, she reports 1 week of nasal congestion, clear rhinorrhea, headache starting yesterday.  She reports ear discomfort, postnasal drip, sinus pain and pressure, upper dental pain, cough productive of green sputum.  No ear pain, pressure, muffled hearing, tinnitus, sore throat, facial swelling, fevers, allergy symptoms.  No wheezing, shortness of breath, body aches.  No double sickening.  She is able to sleep at night without waking up coughing.  She is taking ibuprofen 600 mg with improvement in her symptoms and Mucinex.  Her sinus pain and pressure is worse with bending forward.  Second, she reports intermittent bilateral low back pain for the past 2 days that is dull, achy, lasting minutes, radiating down both of her anterior thighs to her knee.  No fevers, flank pain, abdominal pain, urinary complaints, saddle anesthesia, urinary fecal incontinence, urinary retention, leg weakness.  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  No trauma to the back.  She has been taking ibuprofen for this with improvement in her symptoms.  No aggravating factors.  She has a past medical history of diabetes, breast cancer, vertigo, UTI that manifested itself as low back pain without any urinary symptoms, no history of pyelonephritis, nephrolithiasis, allergies, hypertension.  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   Mammographic microcalcification 2013   Obesity, unspecified 2013   Personal history of radiation therapy    Radiation 10/05/12   treatment start date for 24 treatments     Past Surgical History:  Procedure Laterality Date   BREAST BIOPSY Left 08/02/2012   positive, stereo   BREAST EXCISIONAL BIOPSY Left 08/24/2012   radiation   BREAST LUMPECTOMY Left 2013   BREAST SURGERY Left 08/24/2012   Wide excision left breast DCIS   BREAST SURGERY  09/03/2012   Re excision of both medial and lateral margins on left breast   CESAREAN SECTION  1991,1995,2000   TUBAL LIGATION      Family History  Problem Relation Age of Onset   Colon polyps Father    Breast cancer Neg Hx     Social History   Tobacco Use   Smoking status: Never   Smokeless tobacco: Never  Vaping Use   Vaping status: Never Used  Substance Use Topics   Alcohol use: No   Drug use: No    No current facility-administered medications for this encounter.  Current Outpatient Medications:    amoxicillin-clavulanate (AUGMENTIN) 875-125 MG tablet, Take 1 tablet by mouth every 12 (twelve) hours., Disp: 14 tablet, Rfl: 0   atorvastatin (LIPITOR) 20 MG tablet, Take 20 mg by mouth daily., Disp: , Rfl:    fluticasone (FLONASE) 50 MCG/ACT nasal spray, Place 2 sprays into both nostrils daily., Disp: 16 g, Rfl: 0   glipiZIDE (GLUCOTROL) 10 MG tablet, Take 1 tablet (10 mg total) by mouth 2 (two) times daily before a meal., Disp: 60 tablet, Rfl: 11   JANUVIA 50 MG tablet, Take 50 mg by mouth daily., Disp: , Rfl:    metFORMIN (GLUCOPHAGE) 1000 MG tablet, Take 1,000 mg by mouth 2 (two) times daily.,  Disp: , Rfl:   No Known Allergies   ROS  As noted in HPI.   Physical Exam  BP 108/76 (BP Location: Left Arm)   Pulse 87   Temp 98.8 F (37.1 C) (Oral)   Ht 5\' 4"  (1.626 m)   Wt 90.7 kg   LMP 08/25/2012 (Within Months)   SpO2 95%   BMI 34.33 kg/m   Constitutional: Well developed, well nourished, no acute distress Eyes: PERRL, EOMI, conjunctiva normal bilaterally HENT: Normocephalic, atraumatic,mucus membranes moist.  Positive nasal congestion.  Swollen, erythematous turbinates.  Positive  maxillary, frontal sinus tenderness.  TMs normal bilaterally. Respiratory: Clear to auscultation bilaterally, no rales, no wheezing, no rhonchi Cardiovascular: Normal rate and rhythm, no murmurs, no gallops, no rubs GI: Soft, nondistended, normal bowel sounds, nontender, no rebound, no guarding Back: no CVAT. + left paralumbar tenderness,  - muscle spasm. No L spine, sacral, SI joint bony tenderness.  Bilateral lower extremities nontender, baseline ROM with intact PT pulses, .No pain with int/ext rotation, flex/extension, abduction/adduction hips bilaterally.  Sensation intact to light touch bilaterally over both legs, DTR's symmetric and intact bilaterally KJ, Motor symmetric bilateral 5/5 hip flexion, quadriceps, hamstrings, EHL, foot dorsiflexion, foot plantarflexion, gait normal skin: No rash, skin intact Musculoskeletal: No edema, no tenderness, no deformities Neurologic: Alert & oriented x 3, CN III-XII grossly intact, no motor deficits, sensation grossly intact Psychiatric: Speech and behavior appropriate   ED Course   Medications - No data to display  Orders Placed This Encounter  Procedures   Urine Culture    Standing Status:   Standing    Number of Occurrences:   1    Indication:   Dysuria   Urinalysis, w/ Reflex to Culture (Infection Suspected) -Urine, Clean Catch    Standing Status:   Standing    Number of Occurrences:   1    Specimen Source:   Urine, Clean Catch [76]   Results for orders placed or performed during the hospital encounter of 10/25/23 (from the past 24 hours)  Urinalysis, w/ Reflex to Culture (Infection Suspected) -Urine, Clean Catch     Status: Abnormal   Collection Time: 10/25/23  9:30 AM  Result Value Ref Range   Specimen Source URINE, CLEAN CATCH    Color, Urine YELLOW YELLOW   APPearance HAZY (A) CLEAR   Specific Gravity, Urine >1.030 (H) 1.005 - 1.030   pH 5.0 5.0 - 8.0   Glucose, UA NEGATIVE NEGATIVE mg/dL   Hgb urine dipstick NEGATIVE NEGATIVE    Bilirubin Urine NEGATIVE NEGATIVE   Ketones, ur 15 (A) NEGATIVE mg/dL   Protein, ur 30 (A) NEGATIVE mg/dL   Nitrite NEGATIVE NEGATIVE   Leukocytes,Ua TRACE (A) NEGATIVE   Squamous Epithelial / HPF 11-20 0 - 5 /HPF   WBC, UA 11-20 0 - 5 WBC/hpf   RBC / HPF 6-10 0 - 5 RBC/hpf   Bacteria, UA FEW (A) NONE SEEN   Budding Yeast PRESENT    No results found.  ED Clinical Impression  1. Acute non-recurrent pansinusitis   2. Strain of lumbar region, initial encounter      ED Assessment/Plan     Reviewed labs.  Concentrated urine.  Positive ketones, proteinuria, trace leukocytes, but this is a contaminated specimen.  She has a few bacteria and yeast.  Sending this off for culture prior to initiating treatment.  Doubt pyelonephritis.  She has no other urinary complaints or vaginal itching/discharge.  1.  Acute pansinusitis.  TMs are normal.  She  is close enough to the 10-day mark, that I feel that is reasonable to send home with Augmentin for 7 days to treat acute sinusitis.  Saline nasal irrigation, Flonase, Mucinex, 600 mg ibuprofen, 1000 mg of Tylenol 3-4 times a day as needed for pain.  2.  Left low back pain.  She does a lot of repetitive lifting at work, lifting toddlers.  I suspect musculoskeletal back pain/ lumbar strain.  No signs or symptoms of cauda equina.  Lower extremity strength is normal.  She has left paralumbar tenderness.  Urine is not entirely consistent with a UTI.  No evidence of pyelonephritis.  Home with Tylenol/ibuprofen.  Follow-up with PCP if not improving.  Discussed labs, MDM, treatment plan, and plan for follow-up with patient Discussed sn/sx that should prompt return to the ED. patient agrees with plan.   Meds ordered this encounter  Medications   fluticasone (FLONASE) 50 MCG/ACT nasal spray    Sig: Place 2 sprays into both nostrils daily.    Dispense:  16 g    Refill:  0   amoxicillin-clavulanate (AUGMENTIN) 875-125 MG tablet    Sig: Take 1 tablet by  mouth every 12 (twelve) hours.    Dispense:  14 tablet    Refill:  0      *This clinic note was created using Scientist, clinical (histocompatibility and immunogenetics). Therefore, there may be occasional mistakes despite careful proofreading. ?    Domenick Gong, MD 10/26/23 1023

## 2023-10-25 NOTE — Discharge Instructions (Signed)
 Start plain Mucinex or Mucinex-D to keep the mucous thin and to decongest you.   You may take 600 mg of motrin with 1000 mg of tylenol up to 3-4 times a day as needed for pain. This is an effective combination for pain.  Finish the Augmentin, even if you feel better.  Flonase will also help with the inflammation.. Use a NeilMed sinus rinse with distilled water as often as you want to to reduce nasal congestion. Follow the directions on the box.   Your urine is inconclusive for urinary tract infection.  I am going to send it off for culture prior to treating you on antibiotics.  Take the Tylenol combined with ibuprofen 3-4 times a day.  Heat, gentle stretching and deep tissue massage can be helpful.  Please follow-up with your primary care provider as needed.  Go to the emergency department for numbness between your thighs, if you accidentally urinate or defecate on yourself, for urinary retention, fevers above 100.4, or for other concerns  Go to www.goodrx.com to look up your medications. This will give you a list of where you can find your prescriptions at the most affordable prices. Or you can ask the pharmacist what the cash price is. This is frequently cheaper than going through insurance.

## 2023-10-26 LAB — URINE CULTURE

## 2023-11-30 ENCOUNTER — Ambulatory Visit
Admission: RE | Admit: 2023-11-30 | Discharge: 2023-11-30 | Disposition: A | Discharge status: Home / Self Care | Payer: Self-pay | Source: Ambulatory Visit | Attending: Advanced Practice Midwife | Admitting: Advanced Practice Midwife

## 2023-11-30 DIAGNOSIS — Z1231 Encounter for screening mammogram for malignant neoplasm of breast: Secondary | ICD-10-CM | POA: Insufficient documentation
